# Patient Record
Sex: Female | Born: 1955 | Race: White | Hispanic: No | Smoking: Current every day smoker
Health system: Southern US, Community
[De-identification: ages and names within clinical notes are randomized; demographics above are authoritative.]

## PROBLEM LIST (undated history)

## (undated) DIAGNOSIS — D45 Polycythemia vera: Secondary | ICD-10-CM

## (undated) HISTORY — DX: Polycythemia vera: D45

---

## 2000-11-05 ENCOUNTER — Other Ambulatory Visit: Admission: RE | Admit: 2000-11-05 | Discharge: 2000-11-05 | Payer: Self-pay | Admitting: Obstetrics and Gynecology

## 2002-02-23 ENCOUNTER — Other Ambulatory Visit: Admission: RE | Admit: 2002-02-23 | Discharge: 2002-02-23 | Payer: Self-pay | Admitting: Obstetrics and Gynecology

## 2003-05-04 ENCOUNTER — Other Ambulatory Visit: Admission: RE | Admit: 2003-05-04 | Discharge: 2003-05-04 | Payer: Self-pay | Admitting: Obstetrics and Gynecology

## 2008-08-23 ENCOUNTER — Ambulatory Visit: Payer: Self-pay | Admitting: Obstetrics and Gynecology

## 2008-08-23 ENCOUNTER — Other Ambulatory Visit: Admission: RE | Admit: 2008-08-23 | Discharge: 2008-08-23 | Payer: Self-pay | Admitting: Obstetrics and Gynecology

## 2008-08-23 ENCOUNTER — Encounter: Payer: Self-pay | Admitting: Obstetrics and Gynecology

## 2009-01-10 ENCOUNTER — Emergency Department (HOSPITAL_COMMUNITY): Admission: EM | Admit: 2009-01-10 | Discharge: 2009-01-10 | Payer: Self-pay | Admitting: Family Medicine

## 2014-04-06 ENCOUNTER — Other Ambulatory Visit: Payer: Self-pay

## 2015-06-23 ENCOUNTER — Encounter: Payer: Self-pay | Admitting: Women's Health

## 2015-06-23 ENCOUNTER — Ambulatory Visit (INDEPENDENT_AMBULATORY_CARE_PROVIDER_SITE_OTHER): Payer: BLUE CROSS/BLUE SHIELD | Admitting: Women's Health

## 2015-06-23 ENCOUNTER — Other Ambulatory Visit (HOSPITAL_COMMUNITY)
Admission: RE | Admit: 2015-06-23 | Discharge: 2015-06-23 | Disposition: A | Discharge status: Home / Self Care | Payer: BLUE CROSS/BLUE SHIELD | Source: Ambulatory Visit | Attending: Women's Health | Admitting: Women's Health

## 2015-06-23 VITALS — BP 129/89 | Ht <= 58 in | Wt 115.8 lb

## 2015-06-23 DIAGNOSIS — Z1329 Encounter for screening for other suspected endocrine disorder: Secondary | ICD-10-CM

## 2015-06-23 DIAGNOSIS — F329 Major depressive disorder, single episode, unspecified: Secondary | ICD-10-CM | POA: Diagnosis not present

## 2015-06-23 DIAGNOSIS — Z13220 Encounter for screening for lipoid disorders: Secondary | ICD-10-CM

## 2015-06-23 DIAGNOSIS — F32A Depression, unspecified: Secondary | ICD-10-CM | POA: Insufficient documentation

## 2015-06-23 DIAGNOSIS — Z01419 Encounter for gynecological examination (general) (routine) without abnormal findings: Secondary | ICD-10-CM | POA: Insufficient documentation

## 2015-06-23 DIAGNOSIS — Z1151 Encounter for screening for human papillomavirus (HPV): Secondary | ICD-10-CM | POA: Diagnosis present

## 2015-06-23 DIAGNOSIS — Z13820 Encounter for screening for osteoporosis: Secondary | ICD-10-CM

## 2015-06-23 LAB — CBC WITH DIFFERENTIAL/PLATELET
BASOS ABS: 0.2 10*3/uL — AB (ref 0.0–0.1)
Basophils Relative: 1 % (ref 0–1)
EOS ABS: 0.4 10*3/uL (ref 0.0–0.7)
EOS PCT: 2 % (ref 0–5)
HEMATOCRIT: 54.8 % — AB (ref 36.0–46.0)
Hemoglobin: 18.4 g/dL — ABNORMAL HIGH (ref 12.0–15.0)
LYMPHS ABS: 3.2 10*3/uL (ref 0.7–4.0)
LYMPHS PCT: 17 % (ref 12–46)
MCH: 29 pg (ref 26.0–34.0)
MCHC: 33.6 g/dL (ref 30.0–36.0)
MCV: 86.3 fL (ref 78.0–100.0)
MONO ABS: 1.7 10*3/uL — AB (ref 0.1–1.0)
MONOS PCT: 9 % (ref 3–12)
MPV: 9 fL (ref 8.6–12.4)
Neutro Abs: 13.6 10*3/uL — ABNORMAL HIGH (ref 1.7–7.7)
Neutrophils Relative %: 71 % (ref 43–77)
PLATELETS: 1029 10*3/uL — AB (ref 150–400)
RBC: 6.35 MIL/uL — ABNORMAL HIGH (ref 3.87–5.11)
RDW: 17.4 % — AB (ref 11.5–15.5)
WBC: 19.1 10*3/uL — ABNORMAL HIGH (ref 4.0–10.5)

## 2015-06-23 LAB — COMPREHENSIVE METABOLIC PANEL
ALBUMIN: 4.6 g/dL (ref 3.6–5.1)
ALK PHOS: 98 U/L (ref 33–130)
ALT: 18 U/L (ref 6–29)
AST: 20 U/L (ref 10–35)
BILIRUBIN TOTAL: 0.5 mg/dL (ref 0.2–1.2)
BUN: 19 mg/dL (ref 7–25)
CALCIUM: 10.3 mg/dL (ref 8.6–10.4)
CO2: 18 mmol/L — AB (ref 20–31)
Chloride: 102 mmol/L (ref 98–110)
Creat: 0.76 mg/dL (ref 0.50–1.05)
GLUCOSE: 97 mg/dL (ref 65–99)
POTASSIUM: 5.7 mmol/L — AB (ref 3.5–5.3)
Sodium: 136 mmol/L (ref 135–146)
Total Protein: 7.8 g/dL (ref 6.1–8.1)

## 2015-06-23 LAB — LIPID PANEL
Cholesterol: 219 mg/dL — ABNORMAL HIGH (ref 125–200)
HDL: 51 mg/dL (ref >=46)
LDL CALC: 144 mg/dL — AB (ref <130)
TRIGLYCERIDES: 121 mg/dL (ref <150)
Total CHOL/HDL Ratio: 4.3 Ratio (ref <=5.0)
VLDL: 24 mg/dL (ref <30)

## 2015-06-23 LAB — TSH: TSH: 0.72 mIU/L

## 2015-06-23 MED ORDER — FLUOXETINE HCL 10 MG PO CAPS: ORAL_CAPSULE | ORAL | Status: DC

## 2015-06-23 NOTE — Progress Notes (Signed)
Cassandra Moses [facility] 1956-01-09 [id number]    History:    Presents for annual exam.  Postmenopausal/no HRT/no bleeding. Normal Pap and mammogram history  Not sexually active. Takes no medications. Mammogram last year, scheduled today. No colonoscopy. Reports feeling depressed, used to take Prozac daily about 5 years ago. Smokes 1/2 ppd. Did quit for 5 years, tried Chantix but caused nightmares.  Past medical history, past surgical history, family history and social history were all reviewed and documented in the EPIC chart.  Divorced. Engaged to fiance for 8 years, no plans to marry at this time. 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    ROS:  A ROS was performed and pertinent positives and negatives are included.  Exam:  Filed Vitals:   06/23/15 0834  BP: 129/89    General appearance:  Normal Thyroid:  Symmetrical, normal in size, without palpable masses or nodularity. Respiratory  Auscultation:  Clear without wheezing or rhonchi Cardiovascular  Auscultation:  Regular rate, without rubs, murmurs or gallops  Edema/varicosities:  Not grossly evident Abdominal  Soft,nontender, without masses, guarding or rebound.  Liver/spleen:  No organomegaly noted  Hernia:  None appreciated  Skin  Inspection:  Grossly normal   Breasts: Examined lying and sitting.     Right: Without masses, retractions, discharge or axillary adenopathy.     Left: Without masses, retractions, discharge or axillary adenopathy. Gentitourinary   Inguinal/mons:  Normal without inguinal adenopathy  External genitalia:  Normal  BUS/Urethra/Skene's glands:  Normal  Vagina:  Normal  Cervix:  Normal  Uterus:  normal in size, shape and contour.  Midline and mobile  Adnexa/parametria:     Rt: Without masses or tenderness.   Lt: Without masses or tenderness.  Anus and perineum: Normal  Digital rectal exam: Normal sphincter tone without palpated masses or  tenderness  Assessment/Plan:  60 y.o. DWF G0 for annual exam.    Postmenopausal/no bleeding/no HRT/not sexually active Depression/Anxiety/Prozac restarted  Plan:  Prozac 10mg  po qd, will titrate up as tolerated to 20 mg daily. Denies need for counseling at this time. SBEs, annual screening mammogram, heart healthy diet, increase exercise when possible, 30 minutes per day, increasing leisure activities. Quit smoking, cutting back daily. Lipids, CMP, CBC, TSH, Vit. D, Hep.C. Pap with HR HPV typing. Encouraged to schedule screening colonoscopy, [facility] information given. T dap discussed, declines today. DEXA will schedule.    [place], 9:12 AM 06/23/2015

## 2015-06-23 NOTE — Patient Instructions (Addendum)

## 2015-06-24 ENCOUNTER — Telehealth: Payer: Self-pay | Admitting: *Deleted

## 2015-06-24 DIAGNOSIS — D729 Disorder of white blood cells, unspecified: Secondary | ICD-10-CM

## 2015-06-24 DIAGNOSIS — D691 Qualitative platelet defects: Secondary | ICD-10-CM

## 2015-06-24 LAB — PATHOLOGIST SMEAR REVIEW

## 2015-06-24 LAB — VITAMIN D 25 HYDROXY (VIT D DEFICIENCY, FRACTURES): Vit D, 25-Hydroxy: 13 ng/mL — ABNORMAL LOW (ref 30–100)

## 2015-06-24 LAB — HEPATITIS C ANTIBODY: HCV AB: NEGATIVE

## 2015-06-24 NOTE — Telephone Encounter (Signed)
Referral placed they will contact pt to schedule. 

## 2015-06-24 NOTE — Telephone Encounter (Signed)
-----   Message from Huel Cote, NP sent at 06/24/2015  1:03 PM EST ----- Please schedule hematology appointment, white count 19,000, platelets greater than 1000. Pathology smear not completed yet. Asymptomatic other than depression. Last time in our office 2010, no labs to compare.

## 2015-06-27 LAB — CYTOLOGY - PAP

## 2015-06-28 ENCOUNTER — Encounter: Payer: Self-pay | Admitting: Women's Health

## 2015-06-28 ENCOUNTER — Other Ambulatory Visit: Payer: Self-pay | Admitting: Women's Health

## 2015-06-28 DIAGNOSIS — E559 Vitamin D deficiency, unspecified: Secondary | ICD-10-CM

## 2015-06-28 MED ORDER — VITAMIN D (ERGOCALCIFEROL) 1.25 MG (50000 UNIT) PO CAPS: 50000.0000 [IU] | ORAL_CAPSULE | ORAL | Status: DC

## 2015-06-29 ENCOUNTER — Ambulatory Visit (HOSPITAL_BASED_OUTPATIENT_CLINIC_OR_DEPARTMENT_OTHER): Payer: BLUE CROSS/BLUE SHIELD | Admitting: Hematology & Oncology

## 2015-06-29 ENCOUNTER — Other Ambulatory Visit (HOSPITAL_BASED_OUTPATIENT_CLINIC_OR_DEPARTMENT_OTHER): Payer: BLUE CROSS/BLUE SHIELD

## 2015-06-29 ENCOUNTER — Ambulatory Visit: Payer: BLUE CROSS/BLUE SHIELD

## 2015-06-29 ENCOUNTER — Encounter: Payer: Self-pay | Admitting: Hematology & Oncology

## 2015-06-29 VITALS — BP 172/78 | HR 110 | Temp 98.3°F | Resp 18 | Ht <= 58 in | Wt 115.0 lb

## 2015-06-29 DIAGNOSIS — D471 Chronic myeloproliferative disease: Secondary | ICD-10-CM

## 2015-06-29 DIAGNOSIS — N2889 Other specified disorders of kidney and ureter: Secondary | ICD-10-CM

## 2015-06-29 LAB — CBC WITH DIFFERENTIAL (CANCER CENTER ONLY)
BASO#: 0.2 10*3/uL (ref 0.0–0.2)
BASO%: 1.3 % (ref 0.0–2.0)
EOS%: 2.5 % (ref 0.0–7.0)
Eosinophils Absolute: 0.4 10*3/uL (ref 0.0–0.5)
HEMATOCRIT: 52.8 % — AB (ref 34.8–46.6)
HGB: 18.1 g/dL — ABNORMAL HIGH (ref 11.6–15.9)
LYMPH#: 3.2 10*3/uL (ref 0.9–3.3)
LYMPH%: 19 % (ref 14.0–48.0)
MCH: 29 pg (ref 26.0–34.0)
MCHC: 34.3 g/dL (ref 32.0–36.0)
MCV: 85 fL (ref 81–101)
MONO#: 1.9 10*3/uL — AB (ref 0.1–0.9)
MONO%: 11.1 % (ref 0.0–13.0)
NEUT#: 11.1 10*3/uL — ABNORMAL HIGH (ref 1.5–6.5)
NEUT%: 66.1 % (ref 39.6–80.0)
RBC: 6.25 10*6/uL — ABNORMAL HIGH (ref 3.70–5.32)
RDW: 18.8 % — AB (ref 11.1–15.7)
WBC: 16.7 10*3/uL — ABNORMAL HIGH (ref 3.9–10.0)

## 2015-06-29 LAB — COMPREHENSIVE METABOLIC PANEL (CC13)
ALBUMIN: 4.6 g/dL (ref 3.5–5.5)
ALK PHOS: 105 IU/L (ref 39–117)
ALT: 15 IU/L (ref 0–32)
AST (SGOT): 18 IU/L (ref 0–40)
Albumin/Globulin Ratio: 1.5 (ref 1.1–2.5)
BUN / CREAT RATIO: 19 (ref 9–23)
BUN: 13 mg/dL (ref 6–24)
Bilirubin Total: 0.3 mg/dL (ref 0.0–1.2)
CALCIUM: 9.7 mg/dL (ref 8.7–10.2)
CO2: 24 mmol/L (ref 18–29)
CREATININE: 0.68 mg/dL (ref 0.57–1.00)
Chloride, Ser: 102 mmol/L (ref 96–106)
GFR calc Af Amer: 112 mL/min/{1.73_m2} (ref >59)
GFR, EST NON AFRICAN AMERICAN: 97 mL/min/{1.73_m2} (ref >59)
GLUCOSE: 103 mg/dL — AB (ref 65–99)
Globulin, Total: 3 g/dL (ref 1.5–4.5)
Potassium, Ser: 3.9 mmol/L (ref 3.5–5.2)
Sodium: 136 mmol/L (ref 134–144)
TOTAL PROTEIN: 7.6 g/dL (ref 6.0–8.5)

## 2015-06-29 MED ORDER — HYDROXYUREA 500 MG PO CAPS: 500.0000 mg | ORAL_CAPSULE | Freq: Two times a day (BID) | ORAL | Status: DC

## 2015-06-29 NOTE — Progress Notes (Signed)
Referral MD  Reason for Referral: Thrombocytosis, leukocytosis and erythrocytosis   Chief Complaint  Patient presents with  . Follow-up  : My platelet count has been high.  HPI: Cassandra Moses is a 60 year old white female. She is originally from [place]. She is a Metallurgist. She is busy taking care of her parents right now.  Apparently, 3 years ago, she was seen by a dermatologist. She is found to have rosacea. She was having some blood test done. At that point, she was told that her platelet count was 900,000.  She does not have a family doctor. As such, this is not been looked into.  Recently, she was seen at the gynecologist office. She had lab work done. This showed a white cell count of 19.1. Hemoglobin 18.4. Hematocrit 54.8. Platelet count was 1,029,000.  Because of this, she was kindly referred to the [facility].  She has some electrolytes which were okay.  She was tested for hepatitis C. This was negative. Her vitamin D level was on the low side.  She has a bit of a pain in the left upper quadrant. This is been hurting for a few months. She has occasional headaches.  She's had no pain or burning in the hands or feet. She says that she has some tingling in the fingers and toes.  She's had no weight loss or weight gain. She does smoke about a pack per day of cigarettes. She has rare alcohol use.  Known in the family has had a Blood problems. A brother was diagnosed with acromegaly recently.  She's had no fevers. She's had no infections.  She's had no surgery before.  Overall, her performance status is ECOG 0.  .No past medical history on file.:  No past surgical history on file.:   Current outpatient prescriptions:  .  hydroxyurea (HYDREA) 500 MG capsule, Take 1 capsule (500 mg total) by mouth 2 (two) times daily. May take with food to minimize GI side effects., Disp: 60 capsule, Rfl: 3:  :  No Known Allergies:  No family history  on file.:  Social History   Social History  . Marital Status: Divorced    Spouse Name: N/A  . Number of Children: N/A  . Years of Education: N/A   Occupational History  . Not on file.   Social History Main Topics  . Smoking status: Current Every Day Smoker -- 1.50 packs/day    Types: Cigarettes  . Smokeless tobacco: Not on file  . Alcohol Use: 0.0 oz/week    0 Standard drinks or equivalent per week     Comment: GLASS OF WINE PER DAY  . Drug Use: No  . Sexual Activity: Not Currently     Comment: 17 YEARS , DOESNT REMEMBER   Other Topics Concern  . Not on file   Social History Narrative  :  Pertinent items are noted in HPI.  Exam: @IPVITALS @  69 white female in no obvious distress. Vital signs show a temperature of 98.3. Pulse 110. Blood pressure 172/78. Weight is 115 pounds. Head and neck exam shows no ocular or oral lesions. She has some slight facial plethora. She has no oral lesions. Shows no adenopathy in the neck. Pupils are reactive. The maybe some slight conjunctival inflammation. Lungs are clear. Cardiac exam is slightly tachycardic regular. She has no murmurs, rubs or bruits. Abdomen is soft. She has decent bowel sounds. There is some tenderness in the left upper quadrant. Her spleen tip might be  palpable with deep inspiration. There is no liver edge. Back exam shows no tenderness over the spine, ribs or hips. Extremities shows no clubbing, cyanosis or edema. She has good range of motion of her joints. Skin exam shows a ruddy complexion to her skin. Neurological exam shows no focal neurological deficits.    Recent Labs  06/29/15 1504  WBC 16.7*  HGB 18.1*  HCT 52.8*  PLT 1,147*   No results for input(s): NA, K, CL, CO2, GLUCOSE, BUN, CREATININE, CALCIUM in the last 72 hours.  Blood smear review: Normochromic and normocytic population of red blood cells. She has no new clear of blood cells. There are no teardrop cells. I see no target cells. She has no  inclusion bodies. White cells are normal in morphology and maturation. She has an increased number of white blood cells. There is no immature myeloid or lymphoid cells. Shows no hypersegmented polys. Platelets are markedly increased in number. She has large platelets. Platelets are well granulated.  Pathology: None     Assessment and Plan:  Cassandra Moses is a very nice 60 year old white female. I think she has a myeloproliferative disorder. She looks like she has polycythemia. She has the skin color for polycythemia. All 3 cell lines are increased in number.  It is apparent that she has had this for several years. Her platelet count was quite high back in 2013.  I sent off her blood for the typical genetic markers-JAK2, Calreticulin, and BCR/ABL. I have to believe that she will be positive for one of these.  I will go ahead and put her on a baby aspirin a day. I did this will be helpful.  I suspect that she probably will need to be phlebotomized. We want to keep her hematocrit below 45%.  I am also going to put her on Hydrea. I will start off on 500 mg twice a day. This should hopefully help with the leukocytosis and thrombocytosis.  I set her blood off for iron studies and erythropoietin level. I have to believe that the erythropoietin level will be low.  I spent about an hour with her. I went over her labs with her. I spent her what I thought was a problem. I don't think that we need to do a bone marrow test on her. We probably will have to do one if her genetic studies are normal.  I will see vital ultrasound of the abdomen this week. We will see she has splenomegaly.  I will have her come back in 2 weeks. By then, all of our studies will be done so we can see exactly what myeloproliferative syndrome she has.

## 2015-06-30 ENCOUNTER — Ambulatory Visit (HOSPITAL_BASED_OUTPATIENT_CLINIC_OR_DEPARTMENT_OTHER)
Admission: RE | Admit: 2015-06-30 | Discharge: 2015-06-30 | Disposition: A | Discharge status: Home / Self Care | Payer: BLUE CROSS/BLUE SHIELD | Source: Ambulatory Visit | Attending: Hematology & Oncology | Admitting: Hematology & Oncology

## 2015-06-30 DIAGNOSIS — N2889 Other specified disorders of kidney and ureter: Secondary | ICD-10-CM | POA: Diagnosis not present

## 2015-06-30 DIAGNOSIS — R1012 Left upper quadrant pain: Secondary | ICD-10-CM | POA: Insufficient documentation

## 2015-06-30 DIAGNOSIS — N200 Calculus of kidney: Secondary | ICD-10-CM | POA: Insufficient documentation

## 2015-06-30 DIAGNOSIS — D471 Chronic myeloproliferative disease: Secondary | ICD-10-CM | POA: Diagnosis not present

## 2015-06-30 LAB — CHCC SATELLITE - SMEAR

## 2015-06-30 LAB — FERRITIN: Ferritin: 20 ng/ml (ref 9–269)

## 2015-06-30 LAB — IRON AND TIBC
%SAT: 12 % — AB (ref 21–57)
IRON: 40 ug/dL — AB (ref 41–142)
TIBC: 335 ug/dL (ref 236–444)
UIBC: 295 ug/dL (ref 120–384)

## 2015-06-30 LAB — RETICULOCYTES: Reticulocyte Count: 1.4 % (ref 0.6–2.6)

## 2015-07-01 ENCOUNTER — Ambulatory Visit: Payer: BLUE CROSS/BLUE SHIELD | Admitting: Hematology & Oncology

## 2015-07-01 ENCOUNTER — Other Ambulatory Visit: Payer: BLUE CROSS/BLUE SHIELD

## 2015-07-01 ENCOUNTER — Ambulatory Visit: Payer: BLUE CROSS/BLUE SHIELD

## 2015-07-04 NOTE — Addendum Note (Signed)
Addended by: [name] on: 07/04/2015 06:22 PM   Modules accepted: Orders

## 2015-07-05 ENCOUNTER — Encounter: Payer: Self-pay | Admitting: Hematology & Oncology

## 2015-07-05 ENCOUNTER — Telehealth: Payer: Self-pay | Admitting: *Deleted

## 2015-07-05 NOTE — Telephone Encounter (Signed)
Left a  message on her answer machine. I told her that the ultrasound of the liver and spleen looked okay. However, radiologist made a comment that there is a small mass in the right kidney that looked like a "complicated cyst". Because of this, we are now forced to do an MRI to get a better look at this area. I called her about this. We will try to set the MRI up for this week. I told her to call us if she has any questions.         I told her that lab work so far is come back unremarkable. I'm still waiting for some additional test to return    Message copied from Dr. Marin Olp message.

## 2015-07-06 NOTE — Telephone Encounter (Signed)
Pt has appointment scheduled Dr. Burney Gauze in [place] on 3/8

## 2015-07-07 ENCOUNTER — Encounter: Payer: Self-pay | Admitting: Hematology & Oncology

## 2015-07-08 LAB — CALR MUTATAION(GENPATH): Calr Mutation: POSITIVE

## 2015-07-10 ENCOUNTER — Ambulatory Visit (HOSPITAL_BASED_OUTPATIENT_CLINIC_OR_DEPARTMENT_OTHER)
Admission: RE | Admit: 2015-07-10 | Discharge: 2015-07-10 | Disposition: A | Discharge status: Home / Self Care | Payer: BLUE CROSS/BLUE SHIELD | Source: Ambulatory Visit | Attending: Hematology & Oncology | Admitting: Hematology & Oncology

## 2015-07-10 DIAGNOSIS — N2889 Other specified disorders of kidney and ureter: Secondary | ICD-10-CM | POA: Insufficient documentation

## 2015-07-10 DIAGNOSIS — N281 Cyst of kidney, acquired: Secondary | ICD-10-CM | POA: Insufficient documentation

## 2015-07-10 MED ORDER — GADOBENATE DIMEGLUMINE 529 MG/ML IV SOLN: 10.0000 mL | Freq: Once | INTRAVENOUS | Status: DC | PRN

## 2015-07-12 ENCOUNTER — Ambulatory Visit: Payer: BLUE CROSS/BLUE SHIELD | Admitting: Hematology & Oncology

## 2015-07-12 ENCOUNTER — Other Ambulatory Visit: Payer: BLUE CROSS/BLUE SHIELD

## 2015-07-12 ENCOUNTER — Ambulatory Visit: Payer: BLUE CROSS/BLUE SHIELD

## 2015-07-20 ENCOUNTER — Ambulatory Visit (HOSPITAL_BASED_OUTPATIENT_CLINIC_OR_DEPARTMENT_OTHER): Payer: BLUE CROSS/BLUE SHIELD | Admitting: Hematology & Oncology

## 2015-07-20 ENCOUNTER — Ambulatory Visit (HOSPITAL_BASED_OUTPATIENT_CLINIC_OR_DEPARTMENT_OTHER): Payer: BLUE CROSS/BLUE SHIELD

## 2015-07-20 ENCOUNTER — Other Ambulatory Visit (HOSPITAL_BASED_OUTPATIENT_CLINIC_OR_DEPARTMENT_OTHER): Payer: BLUE CROSS/BLUE SHIELD

## 2015-07-20 ENCOUNTER — Encounter: Payer: Self-pay | Admitting: Hematology & Oncology

## 2015-07-20 VITALS — BP 157/91 | HR 102

## 2015-07-20 VITALS — BP 154/93 | HR 113 | Temp 98.4°F | Resp 16 | Ht <= 58 in | Wt 113.0 lb

## 2015-07-20 DIAGNOSIS — D471 Chronic myeloproliferative disease: Secondary | ICD-10-CM

## 2015-07-20 DIAGNOSIS — D45 Polycythemia vera: Secondary | ICD-10-CM

## 2015-07-20 HISTORY — DX: Polycythemia vera: D45

## 2015-07-20 LAB — COMPREHENSIVE METABOLIC PANEL
ALT: 15 U/L (ref 0–55)
AST: 23 U/L (ref 5–34)
Albumin: 4 g/dL (ref 3.5–5.0)
Alkaline Phosphatase: 90 U/L (ref 40–150)
Anion Gap: 10 mEq/L (ref 3–11)
BILIRUBIN TOTAL: 0.46 mg/dL (ref 0.20–1.20)
BUN: 9.6 mg/dL (ref 7.0–26.0)
CO2: 25 meq/L (ref 22–29)
Calcium: 9.4 mg/dL (ref 8.4–10.4)
Chloride: 104 mEq/L (ref 98–109)
Creatinine: 0.8 mg/dL (ref 0.6–1.1)
EGFR: 77 mL/min/{1.73_m2} — AB (ref >90)
GLUCOSE: 111 mg/dL (ref 70–140)
POTASSIUM: 4.1 meq/L (ref 3.5–5.1)
SODIUM: 139 meq/L (ref 136–145)
TOTAL PROTEIN: 7.6 g/dL (ref 6.4–8.3)

## 2015-07-20 LAB — CBC WITH DIFFERENTIAL (CANCER CENTER ONLY)
BASO#: 0.1 10*3/uL (ref 0.0–0.2)
BASO%: 1.3 % (ref 0.0–2.0)
EOS%: 1 % (ref 0.0–7.0)
Eosinophils Absolute: 0.1 10*3/uL (ref 0.0–0.5)
HCT: 53 % — ABNORMAL HIGH (ref 34.8–46.6)
HEMOGLOBIN: 18.3 g/dL — AB (ref 11.6–15.9)
LYMPH#: 1.8 10*3/uL (ref 0.9–3.3)
LYMPH%: 17.1 % (ref 14.0–48.0)
MCH: 30.6 pg (ref 26.0–34.0)
MCHC: 34.5 g/dL (ref 32.0–36.0)
MCV: 89 fL (ref 81–101)
MONO#: 1.1 10*3/uL — ABNORMAL HIGH (ref 0.1–0.9)
MONO%: 10.3 % (ref 0.0–13.0)
NEUT%: 70.3 % (ref 39.6–80.0)
NEUTROS ABS: 7.5 10*3/uL — AB (ref 1.5–6.5)
Platelets: 424 10*3/uL — ABNORMAL HIGH (ref 145–400)
RBC: 5.99 10*6/uL — ABNORMAL HIGH (ref 3.70–5.32)
RDW: 21.2 % — ABNORMAL HIGH (ref 11.1–15.7)
WBC: 10.7 10*3/uL — AB (ref 3.9–10.0)

## 2015-07-20 LAB — CHCC SATELLITE - SMEAR

## 2015-07-20 NOTE — Progress Notes (Signed)
Cassandra Moses presents today for phlebotomy per MD orders. Phlebotomy procedure started at 0919 and ended at 0930 due to needle clotting. Cannulated with 20g needle without difficulty.. Approximately 500 mls removed. Patient observed for 30 minutes after procedure without any incident. Patient tolerated procedure well. IV needle removed intact.

## 2015-07-20 NOTE — Progress Notes (Signed)
Hematology and Oncology Follow Up Visit  Cassandra Moses [id number] Jul 31, 1955 60 y.o. 07/20/2015   Principle Diagnosis:   Polycythemia vera-JAK2 positive  Current Therapy:    Hydrea 1000 mg by mouth daily  Aspirin 81 mg by mouth daily  Phlebotomy to maintain hematocrit below 45%     Interim History:  Cassandra Moses is back for follow-up. This is Cassandra Moses second office visit. We first saw Cassandra Moses back on February 15. At that point time, Cassandra Moses platelets was over 1 million.  I felt that Cassandra Moses clinical exam was very consistent with polycythemia. We sent off the genetic mutations and she was positive for JAK2 mutation.  I did do a ultrasound of Cassandra Moses abdomen. This showed no splenomegaly. However, there was a small mass in the right kidney. We then got a MRI of the abdomen. This did not show any malignant nature in this small renal mass.  Cassandra Moses iron studies showed a ferritin of 20 with iron saturation of 12%.  Everything clearly points to Cassandra Moses having polycythemia. She is unwell with the Hydrea. She still is tired. I think this probably has to do with the increased hematocrit.  She is still working. She's having no problems with pain in the hands or feet. She has occasional headache. There's no visual changes.  Overall, Cassandra Moses performance status is ECOG 1.  Medications:  Current outpatient prescriptions:  .  FLUoxetine (PROZAC) 10 MG capsule, Take 10 mg by mouth 2 (two) times daily., Disp: , Rfl:  .  hydroxyurea (HYDREA) 500 MG capsule, Take 1 capsule (500 mg total) by mouth 2 (two) times daily. May take with food to minimize GI side effects., Disp: 60 capsule, Rfl: 3  Allergies: No Known Allergies  Past Medical History, Surgical history, Social history, and Family History were reviewed and updated.  Review of Systems: As above  Physical Exam:  height is 4' 10"  (1.473 m) and weight is 113 lb (51.256 kg). Cassandra Moses oral temperature is 98.4 F (36.9 C). Cassandra Moses blood pressure is 154/93 and Cassandra Moses pulse is  113. Cassandra Moses respiration is 16.   Wt Readings from Last 3 Encounters:  07/20/15 113 lb (51.256 kg)  06/29/15 115 lb (52.164 kg)  06/23/15 115 lb 12.8 oz (52.527 kg)     Head and neck exam shows no ocular or oral lesions. She has some slight facial plethora. She has no oral lesions. Shows no adenopathy in the neck. Pupils are reactive. The maybe some slight conjunctival inflammation. Lungs are clear. Cardiac exam is slightly tachycardic regular. She has no murmurs, rubs or bruits. Abdomen is soft. She has decent bowel sounds. There is some tenderness in the left upper quadrant. Cassandra Moses spleen tip might be palpable with deep inspiration. There is no liver edge. Back exam shows no tenderness over the spine, ribs or hips. Extremities shows no clubbing, cyanosis or edema. She has good range of motion of Cassandra Moses joints. Skin exam shows a ruddy complexion to Cassandra Moses skin. Neurological exam shows no focal neurological deficits.   Lab Results  Component Value Date   WBC 10.7* 07/20/2015   HGB 18.3* 07/20/2015   HCT 53.0* 07/20/2015   MCV 89 07/20/2015   PLT 424* 07/20/2015     Chemistry      Component Value Date/Time   NA 139 07/20/2015 0820   NA 136 06/29/2015 1505   NA 136 06/23/2015 0914   K 4.1 07/20/2015 0820   K 3.9 06/29/2015 1505   K 5.7* 06/23/2015 0914   CL 102  06/29/2015 1505   CL 102 06/23/2015 0914   CO2 25 07/20/2015 0820   CO2 24 06/29/2015 1505   CO2 18* 06/23/2015 0914   BUN 9.6 07/20/2015 0820   BUN 13 06/29/2015 1505   BUN 19 06/23/2015 0914   CREATININE 0.8 07/20/2015 0820   CREATININE 0.68 06/29/2015 1505   CREATININE 0.76 06/23/2015 0914      Component Value Date/Time   CALCIUM 9.4 07/20/2015 0820   CALCIUM 9.7 06/29/2015 1505   CALCIUM 10.3 06/23/2015 0914   ALKPHOS 90 07/20/2015 0820   ALKPHOS 105 06/29/2015 1505   ALKPHOS 98 06/23/2015 0914   AST 23 07/20/2015 0820   AST 18 06/29/2015 1505   AST 20 06/23/2015 0914   ALT 15 07/20/2015 0820   ALT 15 06/29/2015 1505    ALT 18 06/23/2015 0914   BILITOT 0.46 07/20/2015 0820   BILITOT 0.3 06/29/2015 1505   BILITOT 0.5 06/23/2015 0914         Impression and Plan: Ms. Plaia is A 60 year old white female with polycythemia vera. She clearly has polycythemia vera. She is JAK2 positive. Despite the Hydrea, she still has erythrocytosis. I suspect that Cassandra Moses red cell mass is elevated.  I think we'll have to physically get blood out of Cassandra Moses. I think we'll have to force Cassandra Moses hematocrit down below 45% with phlebotomies.  For now, we will phlebotomize Cassandra Moses weekly. I really want to get Cassandra Moses hematocrit down. I think was to get Cassandra Moses hematocrit down below 45%, she will feel better. She will have more energy.  As far as the Hydrea is concerned, I will just keep Cassandra Moses on the 1000 mg daily dose for right now.  She does not need a bone marrow biopsy. I don't think a bone marrow biopsy will give [place] any more information and will we have already.  I would like to see Cassandra Moses back in about 3 weeks. We will phlebotomize Cassandra Moses weekly.  I don't see that we have to use JAKAFI at this point. I'm not sure that this would really add more to the Hydrea and baby aspirin. She does not have splenomegaly. She has no systemic symptoms. If we do find that Cassandra Moses hematocrit is having a tough time coming down, then we might have to consider JAKAFI.  I spent about 35 minutes with Cassandra Moses today. I answered all of Cassandra Moses questions.  [name], MD 3/8/20171:27 PM

## 2015-07-20 NOTE — Patient Instructions (Signed)
Therapeutic Phlebotomy, Care After  Refer to this sheet in the next few weeks. These instructions provide you with information about caring for yourself after your procedure. Your health care provider may also give you more specific instructions. Your treatment has been planned according to current medical practices, but problems sometimes occur. Call your health care provider if you have any problems or questions after your procedure.  WHAT TO EXPECT AFTER THE PROCEDURE  After your procedure, it is common to have:   Light-headedness or dizziness. You may feel faint.   Nausea.   Tiredness.  HOME CARE INSTRUCTIONS  Activities   Return to your normal activities as directed by your health care provider. Most people can go back to their normal activities right away.   Avoid strenuous physical activity and heavy lifting or pulling for about 5 hours after the procedure. Do not lift anything that is heavier than 10 lb (4.5 kg).   Athletes should avoid strenuous exercise for at least 12 hours.   Change positions slowly for the remainder of the day. This will help to prevent light-headedness or fainting.   If you feel light-headed, lie down until the feeling goes away.  Eating and Drinking   Be sure to eat well-balanced meals for the next 24 hours.   Drink enough fluid to keep your urine clear or pale yellow.   Avoid drinking alcohol on the day that you had the procedure.  Care of the Needle Insertion Site   Keep your bandage dry. You can remove the bandage after about 5 hours or as directed by your health care provider.   If you have bleeding from the needle insertion site, elevate your arm and press firmly on the site until the bleeding stops.   If you have bruising at the site, apply ice to the area:   Put ice in a plastic bag.   Place a towel between your skin and the bag.   Leave the ice on for 20 minutes, 2-3 times a day for the first 24 hours.   If the swelling does not go away after 24 hours, apply  a warm, moist washcloth to the area for 20 minutes, 2-3 times a day.  General Instructions   Avoid smoking for at least 30 minutes after the procedure.   Keep all follow-up visits as directed by your health care provider. It is important to continue with further therapeutic phlebotomy treatments as directed.  SEEK MEDICAL CARE IF:   You have redness, swelling, or pain at the needle insertion site.   You have fluid, blood, or pus coming from the needle insertion site.   You feel light-headed, dizzy, or nauseated, and the feeling does not go away.   You notice new bruising at the needle insertion site.   You feel weaker than normal.   You have a fever or chills.  SEEK IMMEDIATE MEDICAL CARE IF:   You have severe nausea or vomiting.   You have chest pain.   You have trouble breathing.    This information is not intended to replace advice given to you by your health care provider. Make sure you discuss any questions you have with your health care provider.    Document Released: 10/02/2010 Document Revised: 09/14/2014 Document Reviewed: 04/26/2014  Elsevier Interactive Patient Education 2016 [employer]

## 2015-07-22 LAB — VON WILLEBRAND PANEL
Factor VIII Activity: 86 % (ref 57–163)
PDF IMAGE: 0
vWF Activity: 32 % — ABNORMAL LOW (ref 50–200)
von Willebrand Factor (vWF) Ag: 55 % (ref 50–200)

## 2015-07-26 ENCOUNTER — Ambulatory Visit (HOSPITAL_BASED_OUTPATIENT_CLINIC_OR_DEPARTMENT_OTHER): Payer: BLUE CROSS/BLUE SHIELD

## 2015-07-26 ENCOUNTER — Other Ambulatory Visit (HOSPITAL_BASED_OUTPATIENT_CLINIC_OR_DEPARTMENT_OTHER): Payer: BLUE CROSS/BLUE SHIELD

## 2015-07-26 VITALS — BP 142/91 | HR 116 | Temp 97.7°F | Resp 16

## 2015-07-26 DIAGNOSIS — D45 Polycythemia vera: Secondary | ICD-10-CM

## 2015-07-26 LAB — CBC WITH DIFFERENTIAL (CANCER CENTER ONLY)
BASO#: 0.2 10*3/uL (ref 0.0–0.2)
BASO%: 1.2 % (ref 0.0–2.0)
EOS%: 0.5 % (ref 0.0–7.0)
Eosinophils Absolute: 0.1 10*3/uL (ref 0.0–0.5)
HEMATOCRIT: 51.9 % — AB (ref 34.8–46.6)
HEMOGLOBIN: 18.2 g/dL — AB (ref 11.6–15.9)
LYMPH#: 2.6 10*3/uL (ref 0.9–3.3)
LYMPH%: 19.8 % (ref 14.0–48.0)
MCH: 31.3 pg (ref 26.0–34.0)
MCHC: 35.1 g/dL (ref 32.0–36.0)
MCV: 89 fL (ref 81–101)
MONO#: 1.1 10*3/uL — ABNORMAL HIGH (ref 0.1–0.9)
MONO%: 8.1 % (ref 0.0–13.0)
NEUT%: 70.4 % (ref 39.6–80.0)
NEUTROS ABS: 9.2 10*3/uL — AB (ref 1.5–6.5)
Platelets: 609 10*3/uL — ABNORMAL HIGH (ref 145–400)
RBC: 5.82 10*6/uL — AB (ref 3.70–5.32)
RDW: 21.8 % — ABNORMAL HIGH (ref 11.1–15.7)
WBC: 13 10*3/uL — AB (ref 3.9–10.0)

## 2015-07-26 NOTE — Progress Notes (Signed)
Per Dr Marin Olp, pt to increase Hydrea to 1000mg  twice daily. Written instructions given to pt who verbalizes understanding. dph

## 2015-07-26 NOTE — Patient Instructions (Signed)

## 2015-07-27 NOTE — Progress Notes (Signed)
Cassandra Moses presents today for phlebotomy per MD orders. Phlebotomy procedure started at 1235 and ended at 1245. 500 grams removed using 18g IV catheter.  Patient observed for 30 minutes after procedure without any incident. Patient tolerated procedure well. IV needle removed intact.  Late note from 07/26/15.

## 2015-08-04 ENCOUNTER — Other Ambulatory Visit (HOSPITAL_BASED_OUTPATIENT_CLINIC_OR_DEPARTMENT_OTHER): Payer: BLUE CROSS/BLUE SHIELD

## 2015-08-04 ENCOUNTER — Ambulatory Visit: Payer: BLUE CROSS/BLUE SHIELD

## 2015-08-04 DIAGNOSIS — D45 Polycythemia vera: Secondary | ICD-10-CM | POA: Diagnosis not present

## 2015-08-04 LAB — CBC WITH DIFFERENTIAL (CANCER CENTER ONLY)
BASO#: 0.1 10*3/uL (ref 0.0–0.2)
BASO%: 1.2 % (ref 0.0–2.0)
EOS ABS: 0.1 10*3/uL (ref 0.0–0.5)
EOS%: 1 % (ref 0.0–7.0)
HCT: 44.1 % (ref 34.8–46.6)
HGB: 15.4 g/dL (ref 11.6–15.9)
LYMPH#: 1.5 10*3/uL (ref 0.9–3.3)
LYMPH%: 19.2 % (ref 14.0–48.0)
MCH: 32.5 pg (ref 26.0–34.0)
MCHC: 34.9 g/dL (ref 32.0–36.0)
MCV: 93 fL (ref 81–101)
MONO#: 0.6 10*3/uL (ref 0.1–0.9)
MONO%: 8.1 % (ref 0.0–13.0)
NEUT#: 5.5 10*3/uL (ref 1.5–6.5)
NEUT%: 70.5 % (ref 39.6–80.0)
PLATELETS: 580 10*3/uL — AB (ref 145–400)
RBC: 4.74 10*6/uL (ref 3.70–5.32)
RDW: 22.3 % — ABNORMAL HIGH (ref 11.1–15.7)
WBC: 7.8 10*3/uL (ref 3.9–10.0)

## 2015-08-04 NOTE — Progress Notes (Signed)
Per Dr Marin Olp; no phlebotomy needed today. Gave patient a current schedule and patient to return next Thursday at 0800 for labs md and possible phlebotomy. Patient verbalized understanding.

## 2015-08-08 ENCOUNTER — Telehealth: Payer: Self-pay | Admitting: *Deleted

## 2015-08-08 DIAGNOSIS — Z13820 Encounter for screening for osteoporosis: Secondary | ICD-10-CM

## 2015-08-08 NOTE — Telephone Encounter (Signed)
Cassandra Moses called back and said place order in epic and they will call pt to schedule. I called and left that information on pt voicemail, order placed.

## 2015-08-08 NOTE — Telephone Encounter (Signed)
Pt called requesting order sent to [facility],has appointment with Dr.Ennver on 08/11/15 per pt she was told to dexa could be done there as well. I called his office at [phone] and left a message with the nurse for further instructions on how to proceed.

## 2015-08-09 ENCOUNTER — Ambulatory Visit (HOSPITAL_BASED_OUTPATIENT_CLINIC_OR_DEPARTMENT_OTHER)
Admission: RE | Admit: 2015-08-09 | Discharge: 2015-08-09 | Disposition: A | Discharge status: Home / Self Care | Payer: BLUE CROSS/BLUE SHIELD | Source: Ambulatory Visit | Attending: Women's Health | Admitting: Women's Health

## 2015-08-09 ENCOUNTER — Encounter (HOSPITAL_BASED_OUTPATIENT_CLINIC_OR_DEPARTMENT_OTHER): Payer: Self-pay

## 2015-08-09 DIAGNOSIS — M8580 Other specified disorders of bone density and structure, unspecified site: Secondary | ICD-10-CM | POA: Diagnosis not present

## 2015-08-09 DIAGNOSIS — Z780 Asymptomatic menopausal state: Secondary | ICD-10-CM | POA: Insufficient documentation

## 2015-08-09 DIAGNOSIS — Z13820 Encounter for screening for osteoporosis: Secondary | ICD-10-CM | POA: Insufficient documentation

## 2015-08-09 DIAGNOSIS — Z720 Tobacco use: Secondary | ICD-10-CM | POA: Insufficient documentation

## 2015-08-11 ENCOUNTER — Ambulatory Visit: Payer: BLUE CROSS/BLUE SHIELD

## 2015-08-11 ENCOUNTER — Ambulatory Visit (HOSPITAL_BASED_OUTPATIENT_CLINIC_OR_DEPARTMENT_OTHER): Payer: BLUE CROSS/BLUE SHIELD | Admitting: Hematology & Oncology

## 2015-08-11 ENCOUNTER — Other Ambulatory Visit (HOSPITAL_BASED_OUTPATIENT_CLINIC_OR_DEPARTMENT_OTHER): Payer: BLUE CROSS/BLUE SHIELD

## 2015-08-11 ENCOUNTER — Encounter: Payer: Self-pay | Admitting: Hematology & Oncology

## 2015-08-11 VITALS — BP 159/84 | HR 105 | Temp 97.3°F | Resp 18 | Ht <= 58 in | Wt 114.0 lb

## 2015-08-11 DIAGNOSIS — D45 Polycythemia vera: Secondary | ICD-10-CM

## 2015-08-11 LAB — CBC WITH DIFFERENTIAL (CANCER CENTER ONLY)
BASO#: 0.1 10*3/uL (ref 0.0–0.2)
BASO%: 1.1 % (ref 0.0–2.0)
EOS%: 0.7 % (ref 0.0–7.0)
Eosinophils Absolute: 0 10*3/uL (ref 0.0–0.5)
HCT: 42.7 % (ref 34.8–46.6)
HEMOGLOBIN: 15.1 g/dL (ref 11.6–15.9)
LYMPH#: 1.4 10*3/uL (ref 0.9–3.3)
LYMPH%: 24.8 % (ref 14.0–48.0)
MCH: 33.6 pg (ref 26.0–34.0)
MCHC: 35.4 g/dL (ref 32.0–36.0)
MCV: 95 fL (ref 81–101)
MONO#: 0.6 10*3/uL (ref 0.1–0.9)
MONO%: 11.4 % (ref 0.0–13.0)
NEUT%: 62 % (ref 39.6–80.0)
NEUTROS ABS: 3.4 10*3/uL (ref 1.5–6.5)
Platelets: 154 10*3/uL (ref 145–400)
RBC: 4.49 10*6/uL (ref 3.70–5.32)
RDW: 23.1 % — ABNORMAL HIGH (ref 11.1–15.7)
WBC: 5.5 10*3/uL (ref 3.9–10.0)

## 2015-08-11 LAB — COMPREHENSIVE METABOLIC PANEL
ALT: 15 U/L (ref 0–55)
AST: 19 U/L (ref 5–34)
Albumin: 4 g/dL (ref 3.5–5.0)
Alkaline Phosphatase: 82 U/L (ref 40–150)
Anion Gap: 9 mEq/L (ref 3–11)
BILIRUBIN TOTAL: 0.51 mg/dL (ref 0.20–1.20)
BUN: 13.3 mg/dL (ref 7.0–26.0)
CHLORIDE: 98 meq/L (ref 98–109)
CO2: 23 meq/L (ref 22–29)
Calcium: 9.2 mg/dL (ref 8.4–10.4)
Creatinine: 0.8 mg/dL (ref 0.6–1.1)
EGFR: 87 mL/min/{1.73_m2} — AB (ref >90)
GLUCOSE: 110 mg/dL (ref 70–140)
POTASSIUM: 3.9 meq/L (ref 3.5–5.1)
SODIUM: 131 meq/L — AB (ref 136–145)
TOTAL PROTEIN: 7.4 g/dL (ref 6.4–8.3)

## 2015-08-11 LAB — LACTATE DEHYDROGENASE: LDH: 203 U/L (ref 125–245)

## 2015-08-11 NOTE — Progress Notes (Signed)
Hematology and Oncology Follow Up Visit  Cassandra Moses [id number] 25-Jun-1955 60 y.o. 08/11/2015   Principle Diagnosis:   Polycythemia vera-JAK2 positive  Current Therapy:    Hydrea 1000 mg by mouth daily  Aspirin 81 mg by mouth daily  Phlebotomy to maintain hematocrit below 45%     Interim History:  Cassandra Moses is back for follow-up. She is doing much better. She feels better. She has gotten phlebotomized.  She is on Hydrea. She says she takes it twice a day.  She is still working. She does not feel as tired.  She's had no headache.  His been no cough or shortness of breath. She's had no sweats. She's had no change in bowel or bladder habits. She's had no leg swelling.  She's had no pruritus. Her skin does not feel is dry. She does stay very well hydrated.   Overall, her performance status is ECOG 1.  Medications:  Current outpatient prescriptions:  .  FLUoxetine (PROZAC) 10 MG capsule, Take 10 mg by mouth 2 (two) times daily., Disp: , Rfl:  .  hydroxyurea (HYDREA) 500 MG capsule, Take 1 capsule (500 mg total) by mouth 2 (two) times daily. May take with food to minimize GI side effects., Disp: 60 capsule, Rfl: 3  Allergies: No Known Allergies  Past Medical History, Surgical history, Social history, and Family History were reviewed and updated.  Review of Systems: As above  Physical Exam:  height is 4\' 10"  (1.473 m) and weight is 114 lb (51.71 kg). Her oral temperature is 97.3 F (36.3 C). Her blood pressure is 159/84 and her pulse is 105. Her respiration is 18.   Wt Readings from Last 3 Encounters:  08/11/15 114 lb (51.71 kg)  07/20/15 113 lb (51.256 kg)  06/29/15 115 lb (52.164 kg)     Head and neck exam shows no ocular or oral lesions. She has some slight facial plethora. She has no oral lesions. Shows no adenopathy in the neck. Pupils are reactive. The maybe some slight conjunctival inflammation. Lungs are clear. Cardiac exam is slightly tachycardic  regular. She has no murmurs, rubs or bruits. Abdomen is soft. She has decent bowel sounds. There is some tenderness in the left upper quadrant. Her spleen tip might be palpable with deep inspiration. There is no liver edge. Back exam shows no tenderness over the spine, ribs or hips. Extremities shows no clubbing, cyanosis or edema. She has good range of motion of her joints. Skin exam shows a ruddy complexion to her skin. Neurological exam shows no focal neurological deficits.   Lab Results  Component Value Date   WBC 5.5 08/11/2015   HGB 15.1 08/11/2015   HCT 42.7 08/11/2015   MCV 95 08/11/2015   PLT 154 08/11/2015     Chemistry      Component Value Date/Time   NA 139 07/20/2015 0820   NA 136 06/29/2015 1505   NA 136 06/23/2015 0914   K 4.1 07/20/2015 0820   K 3.9 06/29/2015 1505   K 5.7* 06/23/2015 0914   CL 102 06/29/2015 1505   CL 102 06/23/2015 0914   CO2 25 07/20/2015 0820   CO2 24 06/29/2015 1505   CO2 18* 06/23/2015 0914   BUN 9.6 07/20/2015 0820   BUN 13 06/29/2015 1505   BUN 19 06/23/2015 0914   CREATININE 0.8 07/20/2015 0820   CREATININE 0.68 06/29/2015 1505   CREATININE 0.76 06/23/2015 0914      Component Value Date/Time   CALCIUM 9.4  07/20/2015 0820   CALCIUM 9.7 06/29/2015 1505   CALCIUM 10.3 06/23/2015 0914   ALKPHOS 90 07/20/2015 0820   ALKPHOS 105 06/29/2015 1505   ALKPHOS 98 06/23/2015 0914   AST 23 07/20/2015 0820   AST 18 06/29/2015 1505   AST 20 06/23/2015 0914   ALT 15 07/20/2015 0820   ALT 15 06/29/2015 1505   ALT 18 06/23/2015 0914   BILITOT 0.46 07/20/2015 0820   BILITOT 0.3 06/29/2015 1505   BILITOT 0.5 06/23/2015 0914         Impression and Plan: Cassandra Moses is A 60 year old white female with polycythemia vera. She clearly has polycythemia vera. She is JAK2 positive.   She responded very well to the Hydrea and phlebotomy. She does not need to be phlebotomized which is wonderful.  We will keep her Hydrea dose at 1000 mg a  day.  We'll plan to get her back in about 4 weeks now. I feel confident that her blood counts will stabilize somewhat.    [name], MD 3/30/20178:44 AM

## 2015-08-11 NOTE — Progress Notes (Signed)
Patient does not need phlebotomy today per Dr Marin Olp

## 2015-09-15 ENCOUNTER — Ambulatory Visit (HOSPITAL_BASED_OUTPATIENT_CLINIC_OR_DEPARTMENT_OTHER): Payer: Self-pay | Admitting: Hematology & Oncology

## 2015-09-15 ENCOUNTER — Encounter: Payer: Self-pay | Admitting: Hematology & Oncology

## 2015-09-15 ENCOUNTER — Other Ambulatory Visit (HOSPITAL_BASED_OUTPATIENT_CLINIC_OR_DEPARTMENT_OTHER): Payer: Self-pay

## 2015-09-15 ENCOUNTER — Ambulatory Visit: Payer: Self-pay

## 2015-09-15 VITALS — BP 150/90 | HR 107 | Temp 98.2°F | Resp 18 | Ht <= 58 in | Wt 111.0 lb

## 2015-09-15 DIAGNOSIS — D45 Polycythemia vera: Secondary | ICD-10-CM

## 2015-09-15 LAB — FERRITIN: Ferritin: 81 ng/ml (ref 9–269)

## 2015-09-15 LAB — CBC WITH DIFFERENTIAL (CANCER CENTER ONLY)
BASO#: 0.1 10*3/uL (ref 0.0–0.2)
BASO%: 0.9 % (ref 0.0–2.0)
EOS ABS: 0.1 10*3/uL (ref 0.0–0.5)
EOS%: 0.6 % (ref 0.0–7.0)
HCT: 37.3 % (ref 34.8–46.6)
HGB: 13.5 g/dL (ref 11.6–15.9)
LYMPH#: 2 10*3/uL (ref 0.9–3.3)
LYMPH%: 19.7 % (ref 14.0–48.0)
MCH: 37 pg — AB (ref 26.0–34.0)
MCHC: 36.2 g/dL — ABNORMAL HIGH (ref 32.0–36.0)
MCV: 102 fL — ABNORMAL HIGH (ref 81–101)
MONO#: 1.2 10*3/uL — ABNORMAL HIGH (ref 0.1–0.9)
MONO%: 12 % (ref 0.0–13.0)
NEUT#: 6.7 10*3/uL — ABNORMAL HIGH (ref 1.5–6.5)
NEUT%: 66.8 % (ref 39.6–80.0)
Platelets: 306 10*3/uL (ref 145–400)
RBC: 3.65 10*6/uL — ABNORMAL LOW (ref 3.70–5.32)
WBC: 10 10*3/uL (ref 3.9–10.0)

## 2015-09-15 LAB — COMPREHENSIVE METABOLIC PANEL
ALBUMIN: 3.8 g/dL (ref 3.5–5.0)
ALK PHOS: 101 U/L (ref 40–150)
ALT: 18 U/L (ref 0–55)
AST: 19 U/L (ref 5–34)
Anion Gap: 9 mEq/L (ref 3–11)
BUN: 10.4 mg/dL (ref 7.0–26.0)
CALCIUM: 9.3 mg/dL (ref 8.4–10.4)
CHLORIDE: 100 meq/L (ref 98–109)
CO2: 24 mEq/L (ref 22–29)
Creatinine: 0.8 mg/dL (ref 0.6–1.1)
EGFR: 85 mL/min/{1.73_m2} — AB (ref >90)
Glucose: 114 mg/dl (ref 70–140)
POTASSIUM: 3.9 meq/L (ref 3.5–5.1)
SODIUM: 133 meq/L — AB (ref 136–145)
Total Bilirubin: 0.48 mg/dL (ref 0.20–1.20)
Total Protein: 7.2 g/dL (ref 6.4–8.3)

## 2015-09-15 LAB — IRON AND TIBC
%SAT: 37 % (ref 21–57)
Iron: 104 ug/dL (ref 41–142)
TIBC: 280 ug/dL (ref 236–444)
UIBC: 176 ug/dL (ref 120–384)

## 2015-09-15 LAB — LACTATE DEHYDROGENASE: LDH: 230 U/L (ref 125–245)

## 2015-09-15 LAB — CHCC SATELLITE - SMEAR

## 2015-09-15 NOTE — Progress Notes (Signed)
No phlebotomy today per dr. ennever 

## 2015-09-15 NOTE — Progress Notes (Signed)
Hematology and Oncology Follow Up Visit  Cassandra Moses [id number] [birth year] 60 y.o. 09/15/2015   Principle Diagnosis:   Polycythemia vera-JAK2 positive  Current Therapy:    Hydrea 1000 mg by mouth daily  Aspirin 81 mg by mouth daily  Phlebotomy to maintain hematocrit below 45%     Interim History:  Cassandra Moses is back for follow-up. She is doing much better. She feels better. Her skin color has "lightened up". She definitely does not look as "full".   She has gotten phlebotomized. This really has been a plus for her. I think she pies not been phlebotomize now for about a month or so.  She is on Hydrea. She says she takes it twice a day. She does state that she takes an extra Hydrea on occasion. I would also problem with her doing this.  She is still working. She does not feel as tired.  Unfortunately, her mom, who has dementia, fell and broke her right femur. She has surgery for this. She is in a rehabilitation center now.  There has been no cough or shortness of breath. She's had no sweats. She's had no change in bowel or bladder habits. She's had no leg swelling.  She's had no pruritus. Her skin does not feel is dry. She does stay very well hydrated.   Overall, her performance status is ECOG 1.  Medications:  Current outpatient prescriptions:  .  FLUoxetine (PROZAC) 10 MG capsule, Take 10 mg by mouth 2 (two) times daily., Disp: , Rfl:  .  hydroxyurea (HYDREA) 500 MG capsule, Take 1 capsule (500 mg total) by mouth 2 (two) times daily. May take with food to minimize GI side effects., Disp: 60 capsule, Rfl: 3  Allergies: No Known Allergies  Past Medical History, Surgical history, Social history, and Family History were reviewed and updated.  Review of Systems: As above  Physical Exam:  height is 4\' 10"  (1.473 m) and weight is 111 lb (50.349 kg). Her oral temperature is 98.2 F (36.8 C). Her blood pressure is 150/90 and her pulse is 107. Her respiration is 18.     Wt Readings from Last 3 Encounters:  09/15/15 111 lb (50.349 kg)  08/11/15 114 lb (51.71 kg)  07/20/15 113 lb (51.256 kg)     Head and neck exam shows no ocular or oral lesions. She has some slight facial plethora. She has no oral lesions. Shows no adenopathy in the neck. Pupils are reactive. The maybe some slight conjunctival inflammation. Lungs are clear. Cardiac exam is slightly tachycardic regular. She has no murmurs, rubs or bruits. Abdomen is soft. She has decent bowel sounds. There is some tenderness in the left upper quadrant. Her spleen tip might be palpable with deep inspiration. There is no liver edge. Back exam shows no tenderness over the spine, ribs or hips. Extremities shows no clubbing, cyanosis or edema. She has good range of motion of her joints. Skin exam shows a ruddy complexion to her skin. Neurological exam shows no focal neurological deficits.   Lab Results  Component Value Date   WBC 10.0 09/15/2015   HGB 13.5 09/15/2015   HCT 37.3 09/15/2015   MCV 102* 09/15/2015   PLT 306 09/15/2015     Chemistry      Component Value Date/Time   NA 131* 08/11/2015 0813   NA 136 06/29/2015 1505   NA 136 06/23/2015 0914   K 3.9 08/11/2015 0813   K 3.9 06/29/2015 1505   K 5.7* 06/23/2015 [id number]  CL 102 06/29/2015 1505   CL 102 06/23/2015 0914   CO2 23 08/11/2015 0813   CO2 24 06/29/2015 1505   CO2 18* 06/23/2015 0914   BUN 13.3 08/11/2015 0813   BUN 13 06/29/2015 1505   BUN 19 06/23/2015 0914   CREATININE 0.8 08/11/2015 0813   CREATININE 0.68 06/29/2015 1505   CREATININE 0.76 06/23/2015 0914      Component Value Date/Time   CALCIUM 9.2 08/11/2015 0813   CALCIUM 9.7 06/29/2015 1505   CALCIUM 10.3 06/23/2015 0914   ALKPHOS 82 08/11/2015 0813   ALKPHOS 105 06/29/2015 1505   ALKPHOS 98 06/23/2015 0914   AST 19 08/11/2015 0813   AST 18 06/29/2015 1505   AST 20 06/23/2015 0914   ALT 15 08/11/2015 0813   ALT 15 06/29/2015 1505   ALT 18 06/23/2015 0914   BILITOT  0.51 08/11/2015 0813   BILITOT 0.3 06/29/2015 1505   BILITOT 0.5 06/23/2015 0914         Impression and Plan: Cassandra Moses is A 60 year old white female with polycythemia vera. She clearly has polycythemia vera. She is JAK2 positive.   She responded very well to the Hydrea and phlebotomy. She does not need to be phlebotomized which is wonderful.  We will keep her Hydrea dose at 1000 mg a day.  We'll plan to get her back in about 8 weeks now. I feel confident that her blood counts will stabilize somewhat.    [name], MD 5/4/20179:10 AM

## 2015-10-11 ENCOUNTER — Other Ambulatory Visit: Payer: Self-pay | Admitting: Women's Health

## 2015-10-11 ENCOUNTER — Other Ambulatory Visit: Payer: Self-pay | Admitting: Hematology & Oncology

## 2015-11-09 ENCOUNTER — Other Ambulatory Visit: Payer: Self-pay | Admitting: Hematology & Oncology

## 2015-11-18 ENCOUNTER — Ambulatory Visit (HOSPITAL_BASED_OUTPATIENT_CLINIC_OR_DEPARTMENT_OTHER): Payer: Self-pay | Admitting: Hematology & Oncology

## 2015-11-18 ENCOUNTER — Other Ambulatory Visit (HOSPITAL_BASED_OUTPATIENT_CLINIC_OR_DEPARTMENT_OTHER): Payer: Self-pay

## 2015-11-18 ENCOUNTER — Ambulatory Visit: Payer: Self-pay

## 2015-11-18 ENCOUNTER — Encounter: Payer: Self-pay | Admitting: Hematology & Oncology

## 2015-11-18 VITALS — BP 138/66 | HR 100 | Temp 98.1°F | Resp 16 | Ht <= 58 in | Wt 111.0 lb

## 2015-11-18 DIAGNOSIS — D45 Polycythemia vera: Secondary | ICD-10-CM

## 2015-11-18 LAB — CBC WITH DIFFERENTIAL (CANCER CENTER ONLY)
BASO#: 0.1 10*3/uL (ref 0.0–0.2)
BASO%: 0.6 % (ref 0.0–2.0)
EOS%: 1.3 % (ref 0.0–7.0)
Eosinophils Absolute: 0.1 10*3/uL (ref 0.0–0.5)
HCT: 32.9 % — ABNORMAL LOW (ref 34.8–46.6)
HGB: 12.2 g/dL (ref 11.6–15.9)
LYMPH#: 2.1 10*3/uL (ref 0.9–3.3)
LYMPH%: 24.9 % (ref 14.0–48.0)
MCH: 47.1 pg — ABNORMAL HIGH (ref 26.0–34.0)
MCHC: 37.1 g/dL — ABNORMAL HIGH (ref 32.0–36.0)
MCV: 127 fL — ABNORMAL HIGH (ref 81–101)
MONO#: 1 10*3/uL — ABNORMAL HIGH (ref 0.1–0.9)
MONO%: 11.4 % (ref 0.0–13.0)
NEUT#: 5.3 10*3/uL (ref 1.5–6.5)
NEUT%: 61.8 % (ref 39.6–80.0)
PLATELETS: 338 10*3/uL (ref 145–400)
RBC: 2.59 10*6/uL — ABNORMAL LOW (ref 3.70–5.32)
RDW: 11.8 % (ref 11.1–15.7)
WBC: 8.5 10*3/uL (ref 3.9–10.0)

## 2015-11-18 LAB — COMPREHENSIVE METABOLIC PANEL
ALK PHOS: 86 U/L (ref 40–150)
ALT: 13 U/L (ref 0–55)
ANION GAP: 10 meq/L (ref 3–11)
AST: 17 U/L (ref 5–34)
Albumin: 3.7 g/dL (ref 3.5–5.0)
BUN: 9.3 mg/dL (ref 7.0–26.0)
CALCIUM: 9.1 mg/dL (ref 8.4–10.4)
CHLORIDE: 101 meq/L (ref 98–109)
CO2: 25 mEq/L (ref 22–29)
Creatinine: 0.8 mg/dL (ref 0.6–1.1)
EGFR: 85 mL/min/{1.73_m2} — AB (ref >90)
GLUCOSE: 102 mg/dL (ref 70–140)
POTASSIUM: 5.2 meq/L — AB (ref 3.5–5.1)
Sodium: 135 mEq/L — ABNORMAL LOW (ref 136–145)
Total Bilirubin: 0.35 mg/dL (ref 0.20–1.20)
Total Protein: 7.2 g/dL (ref 6.4–8.3)

## 2015-11-18 LAB — IRON AND TIBC
%SAT: 27 % (ref 21–57)
IRON: 74 ug/dL (ref 41–142)
TIBC: 271 ug/dL (ref 236–444)
UIBC: 198 ug/dL (ref 120–384)

## 2015-11-18 LAB — CHCC SATELLITE - SMEAR

## 2015-11-18 LAB — FERRITIN: FERRITIN: 105 ng/mL (ref 9–269)

## 2015-11-18 NOTE — Progress Notes (Signed)
No phlebotomy needed; HCT 32.9

## 2015-11-18 NOTE — Progress Notes (Signed)
Hematology and Oncology Follow Up Visit  Cassandra Moses [id number] 1956-03-27 60 y.o. 11/18/2015   Principle Diagnosis:   Polycythemia vera-JAK2 positive  Current Therapy:    Hydrea 1000 mg by mouth daily alternate w/ 500mg  po q day  Aspirin 81 mg by mouth daily  Phlebotomy to maintain hematocrit below 45%     Interim History:  Cassandra Moses is back for follow-up. She is feeling a little bit tired. She thinks it might be from the [facility]. This certainly could be the case.  She has had no problems with nausea or vomiting. She had a nice July 4 holiday.  She is still working full-time. She is a Metallurgist.  She's had no diarrhea. She's had no obvious bleeding. She's had no fever. She's had no leg swelling. She's had no rashes.  Again, she does feel tired. She does not have as much energy as she used to. I suspect that she may be iron deficient.   Overall, her performance status is ECOG 1.  Medications:  Current outpatient prescriptions:  .  FLUoxetine (PROZAC) 10 MG capsule, TAKE ONE CAPSULE BY MOUTH ONCE DAILY FOR 2 WEEKS AND THEN TAKE TWO CAPSULE ONCE DAILY IF NEEDED, Disp: 60 capsule, Rfl: 6 .  hydroxyurea (HYDREA) 500 MG capsule, TAKE ONE CAPSULE BY MOUTH TWICE DAILY WITH FOOD TO MINIMIZE GI SIDE EFFECTS, Disp: 60 capsule, Rfl: 0  Allergies: No Known Allergies  Past Medical History, Surgical history, Social history, and Family History were reviewed and updated.  Review of Systems: As above  Physical Exam:  height is 4\' 10"  (1.473 m) and weight is 111 lb (50.349 kg). Her oral temperature is 98.1 F (36.7 C). Her blood pressure is 138/66 and her pulse is 100. Her respiration is 16.   Wt Readings from Last 3 Encounters:  11/18/15 111 lb (50.349 kg)  09/15/15 111 lb (50.349 kg)  08/11/15 114 lb (51.71 kg)     Head and neck exam shows no ocular or oral lesions. She has some slight facial plethora. She has no oral lesions. Shows no adenopathy in the  neck. Pupils are reactive. The maybe some slight conjunctival inflammation. Lungs are clear. Cardiac exam is slightly tachycardic regular. She has no murmurs, rubs or bruits. Abdomen is soft. She has decent bowel sounds. There is some tenderness in the left upper quadrant. Her spleen tip might be palpable with deep inspiration. There is no liver edge. Back exam shows no tenderness over the spine, ribs or hips. Extremities shows no clubbing, cyanosis or edema. She has good range of motion of her joints. Skin exam shows a ruddy complexion to her skin. Neurological exam shows no focal neurological deficits.   Lab Results  Component Value Date   WBC 8.5 11/18/2015   HGB 12.2 11/18/2015   HCT 32.9* 11/18/2015   MCV 127* 11/18/2015   PLT 338 11/18/2015     Chemistry      Component Value Date/Time   NA 135* 11/18/2015 0759   NA 136 06/29/2015 1505   NA 136 06/23/2015 0914   K 5.2* 11/18/2015 0759   K 3.9 06/29/2015 1505   K 5.7* 06/23/2015 0914   CL 102 06/29/2015 1505   CL 102 06/23/2015 0914   CO2 25 11/18/2015 0759   CO2 24 06/29/2015 1505   CO2 18* 06/23/2015 0914   BUN 9.3 11/18/2015 0759   BUN 13 06/29/2015 1505   BUN 19 06/23/2015 0914   CREATININE 0.8 11/18/2015 0759   CREATININE  0.68 06/29/2015 1505   CREATININE 0.76 06/23/2015 0914      Component Value Date/Time   CALCIUM 9.1 11/18/2015 0759   CALCIUM 9.7 06/29/2015 1505   CALCIUM 10.3 06/23/2015 0914   ALKPHOS 86 11/18/2015 0759   ALKPHOS 105 06/29/2015 1505   ALKPHOS 98 06/23/2015 0914   AST 17 11/18/2015 0759   AST 18 06/29/2015 1505   AST 20 06/23/2015 0914   ALT 13 11/18/2015 0759   ALT 15 06/29/2015 1505   ALT 18 06/23/2015 0914   BILITOT 0.35 11/18/2015 0759   BILITOT 0.3 06/29/2015 1505   BILITOT 0.5 06/23/2015 0914         Impression and Plan: Cassandra Moses is a 60 year old white female with polycythemia vera. She clearly has polycythemia vera. She is JAK2 positive.   Her iron studies were done  today. She really is not iron deficient. Her ferritin is 105. Her iron saturation is 27%.   I think we probably can cut back on the Hydrea little bit. I would like to have her take 1000 mg a day alternating with 500 mg a day. If her platelet count goes up a little bit more, I don't think this would be a bad issue. I don't want to see her hemoglobin go down more.  Her MCV is quite high which indicates that she is taking the Hydrea.  I'll plan to get her back in another couple months. Hold, we'll find that she is feeling a little bit better with a little bit more energy.    [name], MD 7/7/201712:59 PM

## 2015-12-02 ENCOUNTER — Other Ambulatory Visit: Payer: Self-pay | Admitting: Women's Health

## 2015-12-16 ENCOUNTER — Other Ambulatory Visit: Payer: Self-pay | Admitting: Hematology & Oncology

## 2016-01-19 ENCOUNTER — Ambulatory Visit (HOSPITAL_BASED_OUTPATIENT_CLINIC_OR_DEPARTMENT_OTHER): Payer: Self-pay | Admitting: Hematology & Oncology

## 2016-01-19 ENCOUNTER — Other Ambulatory Visit: Payer: Self-pay | Admitting: Family

## 2016-01-19 ENCOUNTER — Ambulatory Visit: Payer: Self-pay

## 2016-01-19 ENCOUNTER — Encounter: Payer: Self-pay | Admitting: Hematology & Oncology

## 2016-01-19 ENCOUNTER — Other Ambulatory Visit (HOSPITAL_BASED_OUTPATIENT_CLINIC_OR_DEPARTMENT_OTHER): Payer: Self-pay

## 2016-01-19 VITALS — BP 142/85 | HR 92 | Temp 98.1°F | Resp 16 | Ht <= 58 in | Wt 116.0 lb

## 2016-01-19 DIAGNOSIS — D45 Polycythemia vera: Secondary | ICD-10-CM

## 2016-01-19 LAB — IRON AND TIBC
%SAT: 40 % (ref 21–57)
Iron: 123 ug/dL (ref 41–142)
TIBC: 304 ug/dL (ref 236–444)
UIBC: 181 ug/dL (ref 120–384)

## 2016-01-19 LAB — COMPREHENSIVE METABOLIC PANEL
ALT: 16 U/L (ref 0–55)
AST: 21 U/L (ref 5–34)
Albumin: 3.8 g/dL (ref 3.5–5.0)
Alkaline Phosphatase: 94 U/L (ref 40–150)
Anion Gap: 9 mEq/L (ref 3–11)
BILIRUBIN TOTAL: 0.49 mg/dL (ref 0.20–1.20)
BUN: 12 mg/dL (ref 7.0–26.0)
CHLORIDE: 97 meq/L — AB (ref 98–109)
CO2: 25 meq/L (ref 22–29)
CREATININE: 0.8 mg/dL (ref 0.6–1.1)
Calcium: 9.1 mg/dL (ref 8.4–10.4)
EGFR: 88 mL/min/{1.73_m2} — ABNORMAL LOW (ref >90)
GLUCOSE: 95 mg/dL (ref 70–140)
Potassium: 4.4 mEq/L (ref 3.5–5.1)
SODIUM: 131 meq/L — AB (ref 136–145)
TOTAL PROTEIN: 7.3 g/dL (ref 6.4–8.3)

## 2016-01-19 LAB — CBC WITH DIFFERENTIAL (CANCER CENTER ONLY)
BASO#: 0 10*3/uL (ref 0.0–0.2)
BASO%: 0.5 % (ref 0.0–2.0)
EOS ABS: 0.1 10*3/uL (ref 0.0–0.5)
EOS%: 0.9 % (ref 0.0–7.0)
HCT: 38.4 % (ref 34.8–46.6)
HEMOGLOBIN: 14.2 g/dL (ref 11.6–15.9)
LYMPH#: 2.3 10*3/uL (ref 0.9–3.3)
LYMPH%: 26.5 % (ref 14.0–48.0)
MCH: 44.8 pg — AB (ref 26.0–34.0)
MCHC: 37 g/dL — AB (ref 32.0–36.0)
MCV: 121 fL — ABNORMAL HIGH (ref 81–101)
MONO#: 1.2 10*3/uL — ABNORMAL HIGH (ref 0.1–0.9)
MONO%: 13.4 % — AB (ref 0.0–13.0)
NEUT%: 58.7 % (ref 39.6–80.0)
NEUTROS ABS: 5 10*3/uL (ref 1.5–6.5)
PLATELETS: 270 10*3/uL (ref 145–400)
RBC: 3.17 10*6/uL — ABNORMAL LOW (ref 3.70–5.32)
RDW: 10.4 % — AB (ref 11.1–15.7)
WBC: 8.6 10*3/uL (ref 3.9–10.0)

## 2016-01-19 LAB — FERRITIN: Ferritin: 51 ng/ml (ref 9–269)

## 2016-01-19 LAB — CHCC SATELLITE - SMEAR

## 2016-01-19 NOTE — Progress Notes (Signed)
No treatment today per Dr. Marin Olp orders. HCT 38.4

## 2016-01-19 NOTE — Progress Notes (Signed)
Hematology and Oncology Follow Up Visit  Cassandra Moses [id number] 10-Aug-1955 60 y.o. 01/19/2016   Principle Diagnosis:   Polycythemia vera-JAK2 positive  Current Therapy:    Hydrea  500mg  po q day  Aspirin 81 mg by mouth daily  Phlebotomy to maintain hematocrit below 45%     Interim History:  [name] is back for follow-up. She is doing a little better. We adjusted her Hydrea dose a little bit. She has had a little bit of a difficulty with making sure she alternates 1000 mg with 500 mg. I think to make it easier for her, we will have her just take 500 mg.  She has a little more energy. We last saw her in July, her ferritin was 105 with iron saturation of 27%.   She still has some pain in the left upper quadrant. I'm not sure this is her spleen. I don't think she has any clinical splenomegaly. We may have to get another ultrasound so we can see what is going on.   She has had no change in bowel or bladder habits. She has had no fever. She has had no rashes. She is exercising.   Overall, her performance status is ECOG 1.  Medications:  Current Outpatient Prescriptions:  .  FLUoxetine (PROZAC) 10 MG capsule, TAKE ONE CAPSULE BY MOUTH ONCE DAILY FOR 2 WEEKS AND THEN TAKE TWO CAPSULE ONCE DAILY IF NEEDED, Disp: 60 capsule, Rfl: 6 .  hydroxyurea (HYDREA) 500 MG capsule, TAKE ONE CAPSULE BY MOUTH TWICE DAILY WITH FOOD TO MINIMIZE GI SIDE EFFECTS, Disp: 60 capsule, Rfl: 0  Allergies: No Known Allergies  Past Medical History, Surgical history, Social history, and Family History were reviewed and updated.  Review of Systems: As above  Physical Exam:  height is 4\' 10"  (1.473 m) and weight is 116 lb (52.6 kg). Her temperature is 98.1 F (36.7 C). Her blood pressure is 142/85 (abnormal) and her pulse is 92. Her respiration is 16.   Wt Readings from Last 3 Encounters:  01/19/16 116 lb (52.6 kg)  11/18/15 111 lb (50.3 kg)  09/15/15 111 lb (50.3 kg)     Head and neck  exam shows no ocular or oral lesions. She has some slight facial plethora. She has no oral lesions. Shows no adenopathy in the neck. Pupils are reactive. The maybe some slight conjunctival inflammation. Lungs are clear. Cardiac exam is slightly tachycardic regular. She has no murmurs, rubs or bruits. Abdomen is soft. She has decent bowel sounds. There is some tenderness in the left upper quadrant. Her spleen tip might be palpable with deep inspiration. There is no liver edge. Back exam shows no tenderness over the spine, ribs or hips. Extremities shows no clubbing, cyanosis or edema. She has good range of motion of her joints. Skin exam shows a ruddy complexion to her skin. Neurological exam shows no focal neurological deficits.   Lab Results  Component Value Date   WBC 8.6 01/19/2016   HGB 14.2 01/19/2016   HCT 38.4 01/19/2016   MCV 121 (H) 01/19/2016   PLT 270 01/19/2016     Chemistry      Component Value Date/Time   NA 135 (L) 11/18/2015 0759   K 5.2 (H) 11/18/2015 0759   CL 102 06/29/2015 1505   CO2 25 11/18/2015 0759   BUN 9.3 11/18/2015 0759   CREATININE 0.8 11/18/2015 0759      Component Value Date/Time   CALCIUM 9.1 11/18/2015 0759   ALKPHOS 86 11/18/2015  0759   AST 17 11/18/2015 0759   ALT 13 11/18/2015 0759   BILITOT 0.35 11/18/2015 0759         Impression and Plan: [name] is a 60 year old white female with polycythemia vera. She clearly has polycythemia vera. She is JAK2 positive.   Again, we will cut her Hydrea dose down to 500 mg a day. This will make it a little easier for her.  We will also set her up with another ultrasound. I want to see what her spleen looks like. I did this would be reasonable.  I will plan to get her back in about 6 weeks. I think this would be a good time to follow-up since we are changing her Hydrea dose.  I do not see need for a phlebotomy right now. I do not see a need to switch her over to [place].    [name],  MD 9/7/201711:43 AM

## 2016-02-08 ENCOUNTER — Other Ambulatory Visit: Payer: Self-pay | Admitting: Hematology & Oncology

## 2016-03-01 ENCOUNTER — Other Ambulatory Visit (HOSPITAL_BASED_OUTPATIENT_CLINIC_OR_DEPARTMENT_OTHER): Payer: Self-pay

## 2016-03-01 ENCOUNTER — Ambulatory Visit (HOSPITAL_BASED_OUTPATIENT_CLINIC_OR_DEPARTMENT_OTHER): Payer: Self-pay | Admitting: Hematology & Oncology

## 2016-03-01 ENCOUNTER — Encounter: Payer: Self-pay | Admitting: Hematology & Oncology

## 2016-03-01 ENCOUNTER — Encounter: Payer: Self-pay | Admitting: Oncology

## 2016-03-01 VITALS — BP 141/83 | HR 96 | Temp 98.3°F | Resp 16 | Ht <= 58 in | Wt 117.0 lb

## 2016-03-01 DIAGNOSIS — D45 Polycythemia vera: Secondary | ICD-10-CM

## 2016-03-01 LAB — CBC WITH DIFFERENTIAL (CANCER CENTER ONLY)
BASO#: 0.1 10*3/uL (ref 0.0–0.2)
BASO%: 0.5 % (ref 0.0–2.0)
EOS ABS: 0.1 10*3/uL (ref 0.0–0.5)
EOS%: 0.9 % (ref 0.0–7.0)
HCT: 41.8 % (ref 34.8–46.6)
HEMOGLOBIN: 15.2 g/dL (ref 11.6–15.9)
LYMPH#: 2.5 10*3/uL (ref 0.9–3.3)
LYMPH%: 24.7 % (ref 14.0–48.0)
MCH: 42.3 pg — ABNORMAL HIGH (ref 26.0–34.0)
MCHC: 36.4 g/dL — ABNORMAL HIGH (ref 32.0–36.0)
MCV: 116 fL — ABNORMAL HIGH (ref 81–101)
MONO#: 1.3 10*3/uL — ABNORMAL HIGH (ref 0.1–0.9)
MONO%: 12.4 % (ref 0.0–13.0)
NEUT%: 61.5 % (ref 39.6–80.0)
NEUTROS ABS: 6.3 10*3/uL (ref 1.5–6.5)
PLATELETS: 348 10*3/uL (ref 145–400)
RBC: 3.59 10*6/uL — AB (ref 3.70–5.32)
RDW: 11 % — ABNORMAL LOW (ref 11.1–15.7)
WBC: 10.3 10*3/uL — AB (ref 3.9–10.0)

## 2016-03-01 LAB — IRON AND TIBC
%SAT: 33 % (ref 21–57)
IRON: 110 ug/dL (ref 41–142)
TIBC: 331 ug/dL (ref 236–444)
UIBC: 221 ug/dL (ref 120–384)

## 2016-03-01 LAB — COMPREHENSIVE METABOLIC PANEL
ALBUMIN: 4 g/dL (ref 3.5–5.0)
ALK PHOS: 99 U/L (ref 40–150)
ALT: 14 U/L (ref 0–55)
AST: 21 U/L (ref 5–34)
Anion Gap: 12 mEq/L — ABNORMAL HIGH (ref 3–11)
BILIRUBIN TOTAL: 0.42 mg/dL (ref 0.20–1.20)
BUN: 15.1 mg/dL (ref 7.0–26.0)
CALCIUM: 9.7 mg/dL (ref 8.4–10.4)
CO2: 24 mEq/L (ref 22–29)
Chloride: 103 mEq/L (ref 98–109)
Creatinine: 0.8 mg/dL (ref 0.6–1.1)
EGFR: 85 mL/min/{1.73_m2} — AB (ref >90)
GLUCOSE: 100 mg/dL (ref 70–140)
POTASSIUM: 4.3 meq/L (ref 3.5–5.1)
Sodium: 138 mEq/L (ref 136–145)
TOTAL PROTEIN: 7.9 g/dL (ref 6.4–8.3)

## 2016-03-01 LAB — FERRITIN: FERRITIN: 48 ng/mL (ref 9–269)

## 2016-03-01 LAB — CHCC SATELLITE - SMEAR

## 2016-03-01 LAB — LACTATE DEHYDROGENASE: LDH: 226 U/L (ref 125–245)

## 2016-03-01 NOTE — Progress Notes (Signed)
Hematology and Oncology Follow Up Visit  Cassandra Moses [id number] 1955-11-09 60 y.o. 03/01/2016   Principle Diagnosis:   Polycythemia vera-JAK2 positive  Current Therapy:    Hydrea  500mg  po q day  Aspirin 81 mg by mouth daily  Phlebotomy to maintain hematocrit below 45%     Interim History:  Cassandra Moses is back for follow-up. She is doing better. She has had no problems with the decrease in Hydrea dose.  She's had no pruritus. She's had no cough or shortness of breath. She's had no change in bowel or bladder habits.  She is still working. She is quite busy at work.  She's had no weight loss waking. She is still exercising a little bit.  She's had no headache. She's had no fever. She's had no nausea or vomiting.  Overall, her performance status is ECOG 1.  Medications:  Current Outpatient Prescriptions:  .  FLUoxetine (PROZAC) 10 MG capsule, TAKE ONE CAPSULE BY MOUTH ONCE DAILY FOR 2 WEEKS AND THEN TAKE TWO CAPSULE ONCE DAILY IF NEEDED, Disp: 60 capsule, Rfl: 6 .  hydroxyurea (HYDREA) 500 MG capsule, TAKE ONE CAPSULE BY MOUTH TWICE DAILY WITH FOOD TO MINIMIZE GI SIDE EFFECTS, Disp: 60 capsule, Rfl: 0  Allergies: No Known Allergies  Past Medical History, Surgical history, Social history, and Family History were reviewed and updated.  Review of Systems: As above  Physical Exam:  height is 4\' 10"  (1.473 m) and weight is 117 lb (53.1 kg). Her oral temperature is 98.3 F (36.8 C). Her blood pressure is 141/83 (abnormal) and her pulse is 96. Her respiration is 16.   Wt Readings from Last 3 Encounters:  03/01/16 117 lb (53.1 kg)  01/19/16 116 lb (52.6 kg)  11/18/15 111 lb (50.3 kg)     Head and neck exam shows no ocular or oral lesions. She has some slight facial plethora. She has no oral lesions. Shows no adenopathy in the neck. Pupils are reactive. The maybe some slight conjunctival inflammation. Lungs are clear. Cardiac exam is slightly tachycardic regular.  She has no murmurs, rubs or bruits. Abdomen is soft. She has decent bowel sounds. There is some tenderness in the left upper quadrant. Her spleen tip might be palpable with deep inspiration. There is no liver edge. Back exam shows no tenderness over the spine, ribs or hips. Extremities shows no clubbing, cyanosis or edema. She has good range of motion of her joints. Skin exam shows a ruddy complexion to her skin. Neurological exam shows no focal neurological deficits.   Lab Results  Component Value Date   WBC 10.3 (H) 03/01/2016   HGB 15.2 03/01/2016   HCT 41.8 03/01/2016   MCV 116 (H) 03/01/2016   PLT 348 03/01/2016     Chemistry      Component Value Date/Time   NA 131 (L) 01/19/2016 1000   K 4.4 01/19/2016 1000   CL 102 06/29/2015 1505   CO2 25 01/19/2016 1000   BUN 12.0 01/19/2016 1000   CREATININE 0.8 01/19/2016 1000      Component Value Date/Time   CALCIUM 9.1 01/19/2016 1000   ALKPHOS 94 01/19/2016 1000   AST 21 01/19/2016 1000   ALT 16 01/19/2016 1000   BILITOT 0.49 01/19/2016 1000         Impression and Plan: [name] is a 60 year old white female with polycythemia vera. She clearly has polycythemia vera. She is JAK2 positive.   For right now, I think we probably get her back  in 3 months. I'll plan to get her back after the holidays. I think her blood count is doing quite well. I'll see her platelet go up all back quickly.  We are still making her iron deficient.  Her quality of life is doing quite well right now.  She has such a good attitude. She has a very strong constitution.    Cassandra Napoleon, MD 10/19/201712:24 PM

## 2016-04-21 ENCOUNTER — Other Ambulatory Visit: Payer: Self-pay | Admitting: Hematology & Oncology

## 2016-05-30 ENCOUNTER — Other Ambulatory Visit: Payer: Self-pay

## 2016-05-30 ENCOUNTER — Ambulatory Visit: Payer: Self-pay | Admitting: Hematology & Oncology

## 2016-06-25 ENCOUNTER — Ambulatory Visit (HOSPITAL_BASED_OUTPATIENT_CLINIC_OR_DEPARTMENT_OTHER): Payer: Self-pay | Admitting: Hematology & Oncology

## 2016-06-25 ENCOUNTER — Encounter: Payer: BLUE CROSS/BLUE SHIELD | Admitting: Women's Health

## 2016-06-25 ENCOUNTER — Other Ambulatory Visit (HOSPITAL_BASED_OUTPATIENT_CLINIC_OR_DEPARTMENT_OTHER): Payer: Self-pay

## 2016-06-25 VITALS — BP 151/69 | HR 116 | Temp 98.4°F | Wt 116.0 lb

## 2016-06-25 DIAGNOSIS — D45 Polycythemia vera: Secondary | ICD-10-CM

## 2016-06-25 LAB — CBC WITH DIFFERENTIAL (CANCER CENTER ONLY)
BASO#: 0.1 10*3/uL (ref 0.0–0.2)
BASO%: 0.5 % (ref 0.0–2.0)
EOS ABS: 0.2 10*3/uL (ref 0.0–0.5)
EOS%: 1.3 % (ref 0.0–7.0)
HCT: 44.2 % (ref 34.8–46.6)
HEMOGLOBIN: 16.4 g/dL — AB (ref 11.6–15.9)
LYMPH#: 3.3 10*3/uL (ref 0.9–3.3)
LYMPH%: 27.6 % (ref 14.0–48.0)
MCH: 40.9 pg — ABNORMAL HIGH (ref 26.0–34.0)
MCHC: 37.1 g/dL — ABNORMAL HIGH (ref 32.0–36.0)
MCV: 110 fL — ABNORMAL HIGH (ref 81–101)
MONO#: 1 10*3/uL — ABNORMAL HIGH (ref 0.1–0.9)
MONO%: 8.4 % (ref 0.0–13.0)
NEUT%: 62.2 % (ref 39.6–80.0)
NEUTROS ABS: 7.5 10*3/uL — AB (ref 1.5–6.5)
PLATELETS: 460 10*3/uL — AB (ref 145–400)
RBC: 4.01 10*6/uL (ref 3.70–5.32)
RDW: 12 % (ref 11.1–15.7)
WBC: 12.1 10*3/uL — ABNORMAL HIGH (ref 3.9–10.0)

## 2016-06-25 LAB — TECHNOLOGIST REVIEW CHCC SATELLITE

## 2016-06-25 LAB — COMPREHENSIVE METABOLIC PANEL
ALT: 16 U/L (ref 0–55)
AST: 21 U/L (ref 5–34)
Albumin: 4.1 g/dL (ref 3.5–5.0)
Alkaline Phosphatase: 96 U/L (ref 40–150)
Anion Gap: 10 mEq/L (ref 3–11)
BILIRUBIN TOTAL: 0.44 mg/dL (ref 0.20–1.20)
BUN: 10.3 mg/dL (ref 7.0–26.0)
CHLORIDE: 104 meq/L (ref 98–109)
CO2: 23 meq/L (ref 22–29)
Calcium: 9.7 mg/dL (ref 8.4–10.4)
Creatinine: 0.9 mg/dL (ref 0.6–1.1)
EGFR: 71 mL/min/{1.73_m2} — AB (ref >90)
GLUCOSE: 136 mg/dL (ref 70–140)
POTASSIUM: 3.9 meq/L (ref 3.5–5.1)
SODIUM: 138 meq/L (ref 136–145)
TOTAL PROTEIN: 7.6 g/dL (ref 6.4–8.3)

## 2016-06-25 LAB — CHCC SATELLITE - SMEAR

## 2016-06-25 LAB — LACTATE DEHYDROGENASE: LDH: 252 U/L — AB (ref 125–245)

## 2016-06-25 NOTE — Progress Notes (Signed)
Hematology and Oncology Follow Up Visit  Cassandra Moses [id number] June 29, 1955 61 y.o. 06/25/2016   Principle Diagnosis:   Polycythemia vera-JAK2 positive  Current Therapy:    Hydrea  500mg  po q day, alternating with 1000 mg daily  Aspirin 81 mg by mouth daily  Phlebotomy to maintain hematocrit below 45%     Interim History:  Cassandra Moses is back for follow-up. She is doing okay. 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  She is doing well with the Hydrea. We might have to increase the dose to 1000 mg daily alternating with 500 mg daily. Her platelet count is going up a little bit.  She's had no headache. She's had no pruritus. She's had no rashes. There's been no change in bowel or bladder habits.  She's doing well with aspirin. There is no bleeding.  Overall, her performance status is ECOG 1.  Medications:  Current Outpatient Prescriptions:  .  FLUoxetine (PROZAC) 10 MG capsule, TAKE ONE CAPSULE BY MOUTH ONCE DAILY FOR 2 WEEKS AND THEN TAKE TWO CAPSULE ONCE DAILY IF NEEDED (Patient not taking: Reported on 06/25/2016), Disp: 60 capsule, Rfl: 6 .  hydroxyurea (HYDREA) 500 MG capsule, TAKE ONE CAPSULE BY MOUTH TWICE DAILY WITH FOOD TO MINIMIZE GI SIDE EFFECTS, Disp: 60 capsule, Rfl: 0  Allergies: No Known Allergies  Past Medical History, Surgical history, Social history, and Family History were reviewed and updated.  Review of Systems: As above  Physical Exam:  weight is 116 lb (52.6 kg). Her oral temperature is 98.4 F (36.9 C). Her blood pressure is 151/69 (abnormal) and her pulse is 116 (abnormal).   Wt Readings from Last 3 Encounters:  06/25/16 116 lb (52.6 kg)  03/01/16 117 lb (53.1 kg)  01/19/16 116 lb (52.6 kg)     Head and neck exam shows no ocular or oral lesions. She has some slight facial plethora. She has no oral lesions. Shows no adenopathy in the neck. Pupils are  reactive. The maybe some slight conjunctival inflammation. Lungs are clear. Cardiac exam is slightly tachycardic regular. She has no murmurs, rubs or bruits. Abdomen is soft. She has decent bowel sounds. There is some tenderness in the left upper quadrant. Her spleen tip might be palpable with deep inspiration. There is no liver edge. Back exam shows no tenderness over the spine, ribs or hips. Extremities shows no clubbing, cyanosis or edema. She has good range of motion of her joints. Skin exam shows a ruddy complexion to her skin. Neurological exam shows no focal neurological deficits.   Lab Results  Component Value Date   WBC 12.1 (H) 06/25/2016   HGB 16.4 (H) 06/25/2016   HCT 44.2 06/25/2016   MCV 110 (H) 06/25/2016   PLT 460 (H) 06/25/2016     Chemistry      Component Value Date/Time   NA 138 03/01/2016 1124   K 4.3 03/01/2016 1124   CL 102 06/29/2015 1505   CO2 24 03/01/2016 1124   BUN 15.1 03/01/2016 1124   CREATININE 0.8 03/01/2016 1124      Component Value Date/Time   CALCIUM 9.7 03/01/2016 1124   ALKPHOS 99 03/01/2016 1124   AST 21 03/01/2016 1124   ALT 14 03/01/2016 1124   BILITOT 0.42 03/01/2016 1124         Impression and Plan: Cassandra Moses is a 61 year old white female with polycythemia vera. She  clearly has polycythemia vera. She is JAK2 positive.   Since we are changing her dose of Hydrea, I will see her back in 3 months.  She does not need to be phlebotomized today. has such a good attitude.  She is very kind and gave me a present from her sister. It is pickled okra.   [name], MD 2/12/20181:11 PM

## 2016-06-26 ENCOUNTER — Encounter: Payer: BLUE CROSS/BLUE SHIELD | Admitting: Women's Health

## 2016-06-26 ENCOUNTER — Other Ambulatory Visit: Payer: Self-pay | Admitting: Hematology & Oncology

## 2016-06-26 LAB — IRON AND TIBC
%SAT: 36 % (ref 21–57)
Iron: 117 ug/dL (ref 41–142)
TIBC: 324 ug/dL (ref 236–444)
UIBC: 206 ug/dL (ref 120–384)

## 2016-06-26 LAB — FERRITIN: FERRITIN: 35 ng/mL (ref 9–269)

## 2016-09-24 ENCOUNTER — Ambulatory Visit (HOSPITAL_BASED_OUTPATIENT_CLINIC_OR_DEPARTMENT_OTHER): Payer: Self-pay | Admitting: Hematology & Oncology

## 2016-09-24 ENCOUNTER — Other Ambulatory Visit (HOSPITAL_BASED_OUTPATIENT_CLINIC_OR_DEPARTMENT_OTHER): Payer: Self-pay

## 2016-09-24 VITALS — BP 142/81 | HR 102 | Temp 97.7°F | Resp 17 | Wt 116.0 lb

## 2016-09-24 DIAGNOSIS — D45 Polycythemia vera: Secondary | ICD-10-CM

## 2016-09-24 LAB — CMP (CANCER CENTER ONLY)
ALBUMIN: 4.1 g/dL (ref 3.3–5.5)
ALT(SGPT): 14 U/L (ref 10–47)
AST: 24 U/L (ref 11–38)
Alkaline Phosphatase: 97 U/L — ABNORMAL HIGH (ref 26–84)
BUN, Bld: 9 mg/dL (ref 7–22)
CO2: 26 mEq/L (ref 18–33)
Calcium: 9.6 mg/dL (ref 8.0–10.3)
Chloride: 105 mEq/L (ref 98–108)
Creat: 0.6 mg/dl (ref 0.6–1.2)
Glucose, Bld: 118 mg/dL (ref 73–118)
POTASSIUM: 4 meq/L (ref 3.3–4.7)
Sodium: 137 mEq/L (ref 128–145)
TOTAL PROTEIN: 7.8 g/dL (ref 6.4–8.1)
Total Bilirubin: 0.7 mg/dl (ref 0.20–1.60)

## 2016-09-24 LAB — FERRITIN: FERRITIN: 61 ng/mL (ref 9–269)

## 2016-09-24 LAB — CBC WITH DIFFERENTIAL (CANCER CENTER ONLY)
BASO#: 0.1 10*3/uL (ref 0.0–0.2)
BASO%: 0.8 % (ref 0.0–2.0)
EOS%: 1.5 % (ref 0.0–7.0)
Eosinophils Absolute: 0.2 10*3/uL (ref 0.0–0.5)
HCT: 42.3 % (ref 34.8–46.6)
HGB: 15.2 g/dL (ref 11.6–15.9)
LYMPH#: 2.4 10*3/uL (ref 0.9–3.3)
LYMPH%: 24 % (ref 14.0–48.0)
MCH: 40.5 pg — ABNORMAL HIGH (ref 26.0–34.0)
MCHC: 35.9 g/dL (ref 32.0–36.0)
MCV: 113 fL — ABNORMAL HIGH (ref 81–101)
MONO#: 1.1 10*3/uL — AB (ref 0.1–0.9)
MONO%: 11 % (ref 0.0–13.0)
NEUT%: 62.7 % (ref 39.6–80.0)
NEUTROS ABS: 6.2 10*3/uL (ref 1.5–6.5)
RBC: 3.75 10*6/uL (ref 3.70–5.32)
RDW: 12.4 % (ref 11.1–15.7)
WBC: 9.9 10*3/uL (ref 3.9–10.0)

## 2016-09-24 LAB — IRON AND TIBC
%SAT: 46 % (ref 21–57)
IRON: 140 ug/dL (ref 41–142)
TIBC: 303 ug/dL (ref 236–444)
UIBC: 163 ug/dL (ref 120–384)

## 2016-09-24 LAB — LACTATE DEHYDROGENASE: LDH: 212 U/L (ref 125–245)

## 2016-09-24 NOTE — Progress Notes (Signed)
Hematology and Oncology Follow Up Visit  Cassandra Moses [id number] August 26, 1955 61 y.o. 09/24/2016   Principle Diagnosis:   Polycythemia vera-JAK2 positive  Current Therapy:    Hydrea  500mg  po q day, alternating with 1000 mg daily  Aspirin 81 mg by mouth daily  Phlebotomy to maintain hematocrit below 45%     Interim History:  [name] is back for follow-up. She is doing okay. She is busy. She had a busy Mother's Day weekend. Her mother came over to her house.  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  She's had no problems with the increased Hydrea dose. There is no nausea or vomiting. She's having no diarrhea. She's having no bleeding. There's been no leg swelling. She's had no rashes.  Of note, back in February, her iron studies showed a ferritin of 35 with an iron saturation of 36%.  Overall, her performance status is ECOG 1.  Medications:  Current Outpatient Prescriptions:  .  hydroxyurea (HYDREA) 500 MG capsule, TAKE ONE CAPSULE BY MOUTH TWICE DAILY WITH FOOD TO  MINIMIZE  GI  SIDE  EFFECTS, Disp: 60 capsule, Rfl: 4  Allergies: No Known Allergies  Past Medical History, Surgical history, Social history, and Family History were reviewed and updated.  Review of Systems: As above  Physical Exam:  weight is 116 lb (52.6 kg). Her oral temperature is 97.7 F (36.5 C). Her blood pressure is 142/81 (abnormal) and her pulse is 102 (abnormal). Her respiration is 17 and oxygen saturation is 98%.   Wt Readings from Last 3 Encounters:  09/24/16 116 lb (52.6 kg)  06/25/16 116 lb (52.6 kg)  03/01/16 117 lb (53.1 kg)     Head and neck exam shows no ocular or oral lesions. She has some slight facial plethora. She has no oral lesions. Shows no adenopathy in the neck. Pupils are reactive. The maybe some slight conjunctival inflammation. Lungs are clear. Cardiac exam is slightly tachycardic regular. She has no murmurs, rubs or bruits. Abdomen is soft. She  has decent bowel sounds. There is some tenderness in the left upper quadrant. Her spleen tip might be palpable with deep inspiration. There is no liver edge. Back exam shows no tenderness over the spine, ribs or hips. Extremities shows no clubbing, cyanosis or edema. She has good range of motion of her joints. Skin exam shows a ruddy complexion to her skin. Neurological exam shows no focal neurological deficits.   Lab Results  Component Value Date   WBC 9.9 09/24/2016   HGB 15.2 09/24/2016   HCT 42.3 09/24/2016   MCV 113 (H) 09/24/2016   PLT 414 Platelet count consistent in citrate (H) 09/24/2016     Chemistry      Component Value Date/Time   NA 138 06/25/2016 1159   K 3.9 06/25/2016 1159   CL 102 06/29/2015 1505   CO2 23 06/25/2016 1159   BUN 10.3 06/25/2016 1159   CREATININE 0.9 06/25/2016 1159      Component Value Date/Time   CALCIUM 9.7 06/25/2016 1159   ALKPHOS 96 06/25/2016 1159   AST 21 06/25/2016 1159   ALT 16 06/25/2016 1159   BILITOT 0.44 06/25/2016 1159         Impression and Plan: [name] is a 61 year old white female with polycythemia vera. She clearly has polycythemia vera. She is JAK2 positive.   Her platelet count is doing better. As such, I think we can probably get her back in 4 months.  Today [birth year] her birthday. She will have a wonderful day.   [name], MD 5/14/201810:43 AM

## 2017-01-13 IMAGING — US US ABDOMEN COMPLETE
1 series · 13 of 25 positions shown · non-contrast
Comparison: None.

CLINICAL DATA: Left upper quadrant pain for 6-9 months.

EXAM:
ABDOMEN ULTRASOUND COMPLETE

[Series 1: us abdomen complete · 0.12mm/px · 13 of 110 slices shown]
[im 1/110]
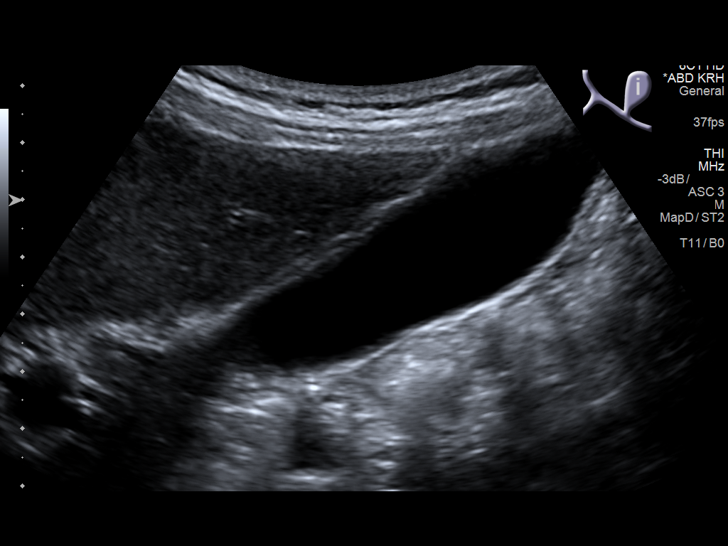
[im 10/110]
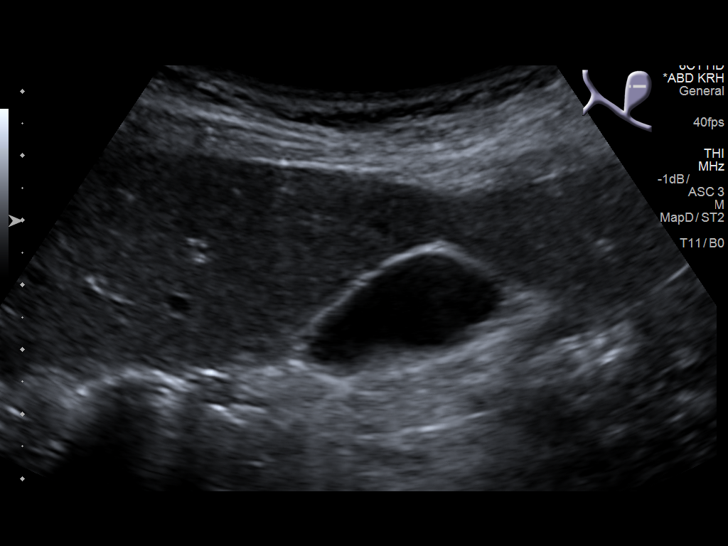
[im 19/110]
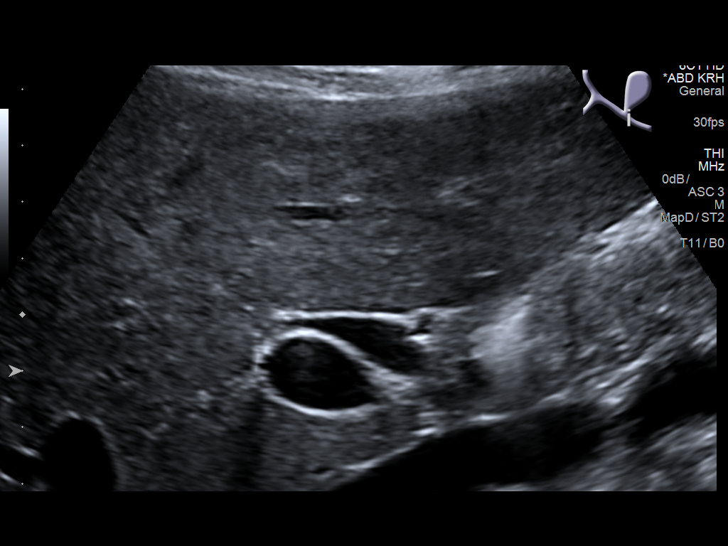
[im 28/110]
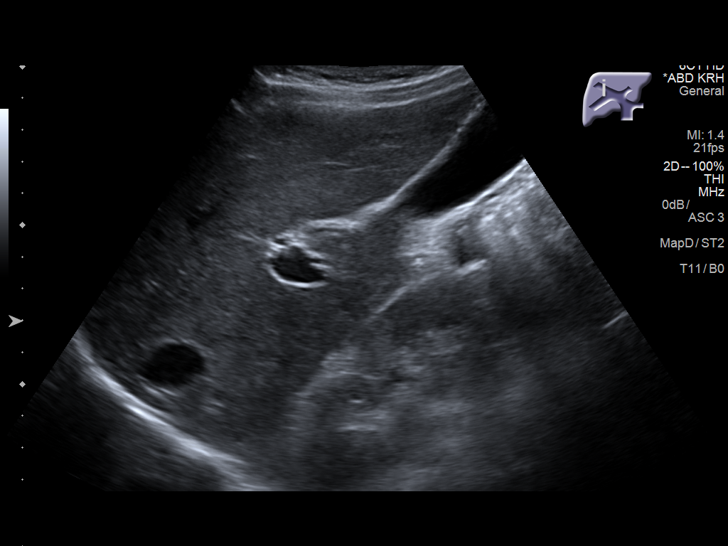
[im 37/110]
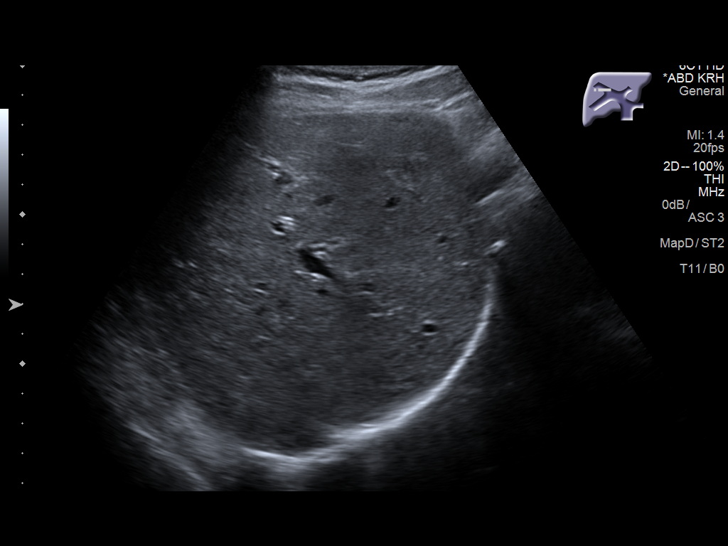
[im 46/110]
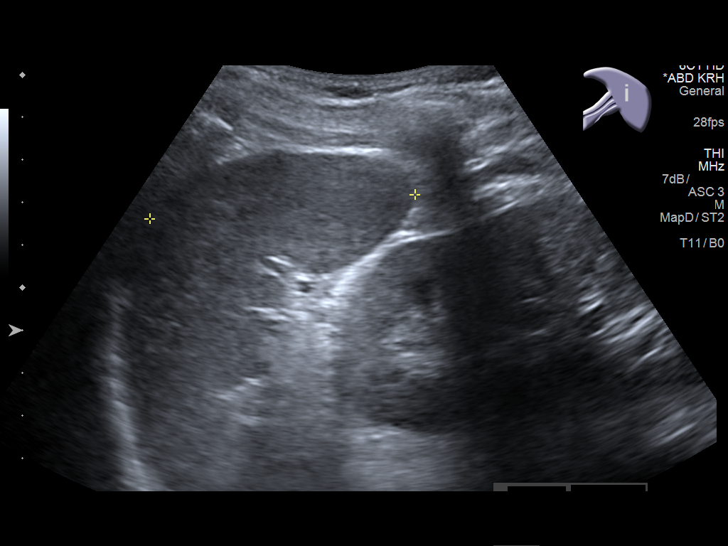
[im 55/110]
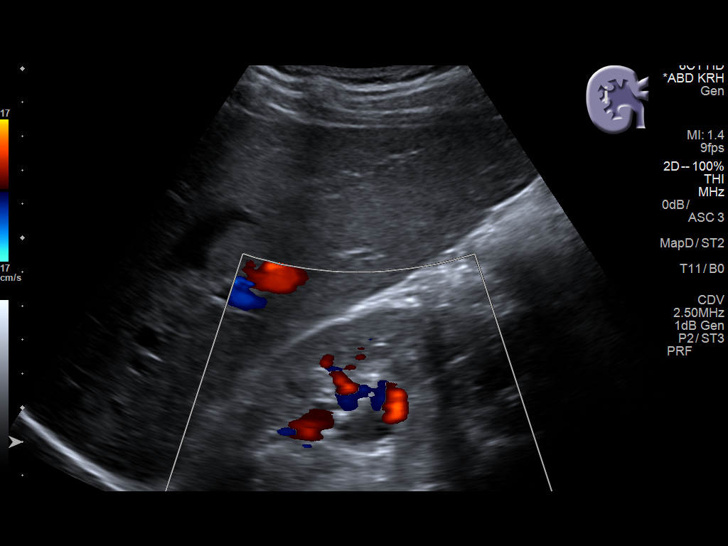
[im 64/110]
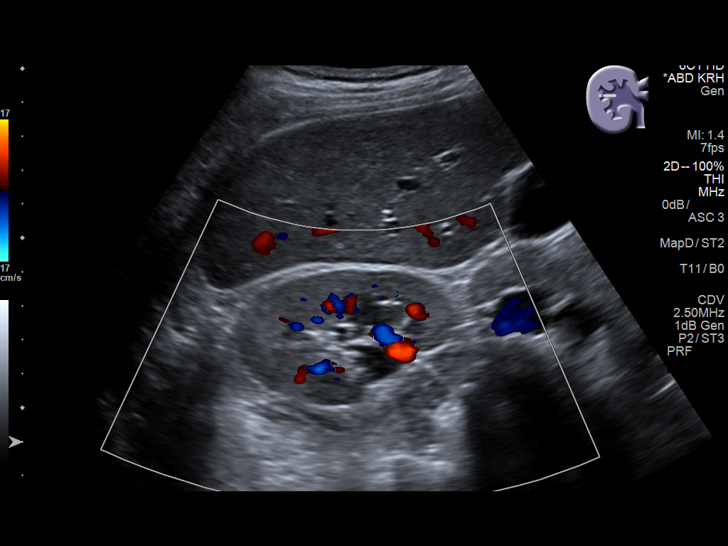
[im 73/110]
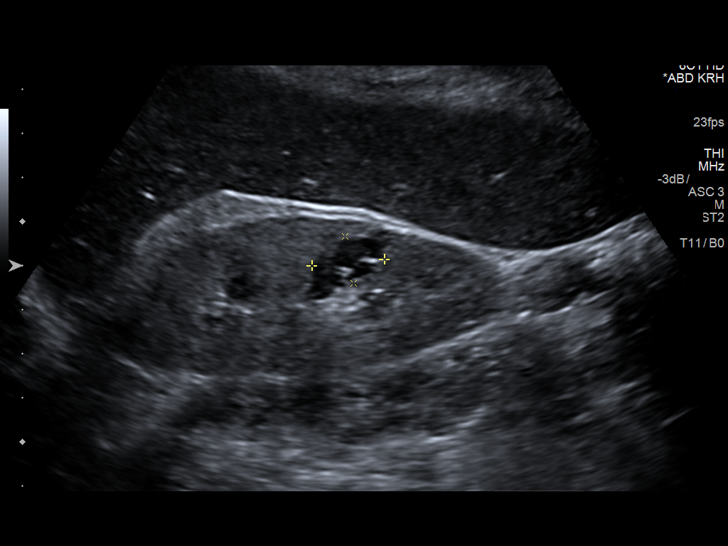
[im 82/110]
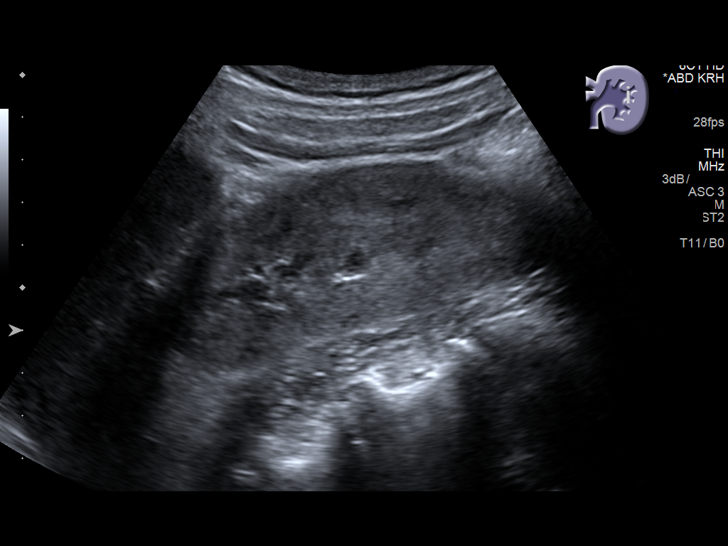
[im 91/110]
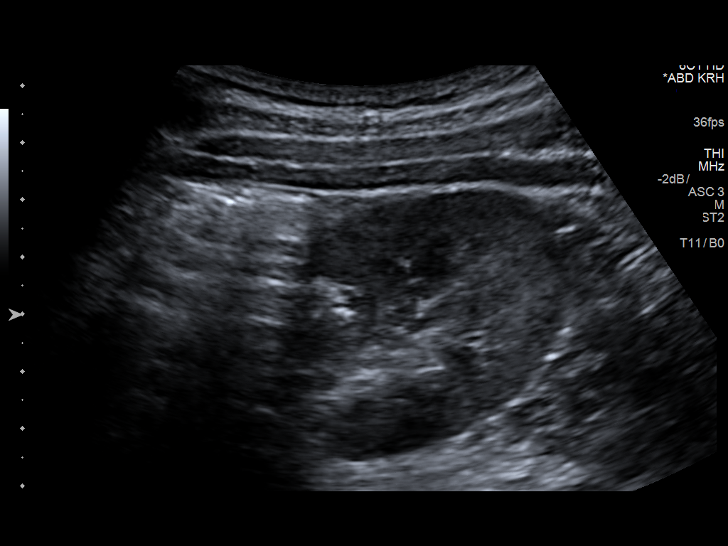
[im 100/110]
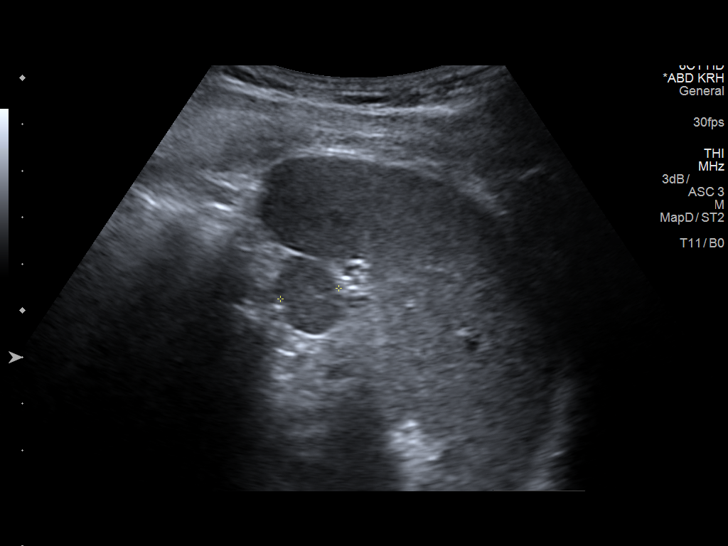
[im 110/110]
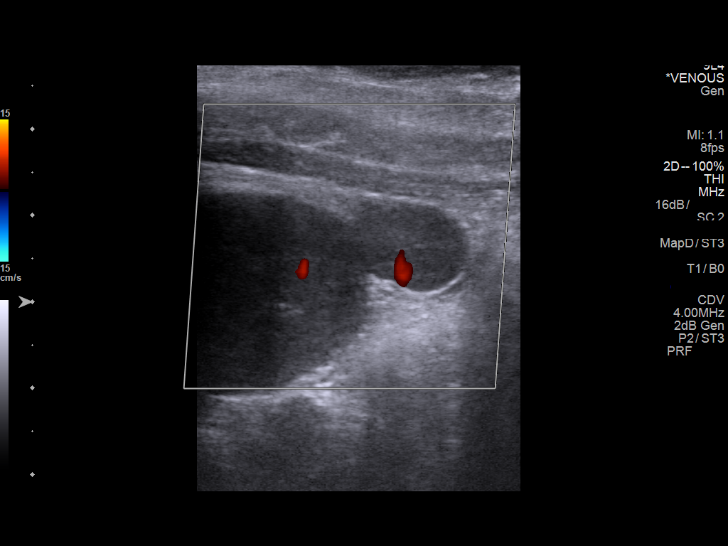

[13 of 25 positions shown; findings below may reference images not displayed]

FINDINGS: Gallbladder: No gallstones or wall thickening visualized. No
sonographic Murphy sign noted by sonographer.

Common bile duct: Diameter: 5 mm

Liver: No focal lesion identified. Within normal limits in
parenchymal echogenicity.

IVC: Limited visualization secondary to overlying bowel gas.

Pancreas: Visualized portion unremarkable.

Spleen: Normal in size. No focal abnormality. Two adjacent isoechoic
nodular areas likely reflecting splenules.

Right Kidney: Length: 11.3 cm. 11 mm echogenic focus in the inferior
pole likely reflecting nephrolithiasis. 1.7 x 1.7 x 1.1 cm
hypoechoic right renal mass with thin internal septation concerning
for a mildly complicated cyst. Echogenicity within normal limits. No
hydronephrosis visualized.

Left Kidney: Length: 10.6 cm. 4 mm echogenic focus in the midpole
likely reflecting nephrolithiasis. Echogenicity within normal
limits. No mass or hydronephrosis visualized.

Abdominal aorta: No aneurysm visualized.

Other findings: None.
IMPRESSION: 1. No cholelithiasis or sonographic evidence of acute cholecystitis.
2. Bilateral nephrolithiasis.
3. 1.7 x 1.7 x 1.1 cm hypoechoic right renal mass with thin internal
septation concerning for a mildly complicated cyst. Further
evaluation with nonemergent CT or MRI of the abdomen is recommended
for better characterization.

## 2017-01-21 ENCOUNTER — Other Ambulatory Visit: Payer: Self-pay

## 2017-01-21 ENCOUNTER — Ambulatory Visit: Payer: Self-pay | Admitting: Hematology & Oncology

## 2017-01-21 ENCOUNTER — Other Ambulatory Visit: Payer: Self-pay | Admitting: Hematology & Oncology

## 2017-03-04 ENCOUNTER — Ambulatory Visit (HOSPITAL_BASED_OUTPATIENT_CLINIC_OR_DEPARTMENT_OTHER): Payer: Self-pay | Admitting: Family

## 2017-03-04 ENCOUNTER — Other Ambulatory Visit (HOSPITAL_BASED_OUTPATIENT_CLINIC_OR_DEPARTMENT_OTHER): Payer: Self-pay

## 2017-03-04 VITALS — BP 156/80 | HR 101 | Temp 97.9°F | Resp 18 | Wt 119.0 lb

## 2017-03-04 DIAGNOSIS — D509 Iron deficiency anemia, unspecified: Secondary | ICD-10-CM | POA: Insufficient documentation

## 2017-03-04 DIAGNOSIS — D45 Polycythemia vera: Secondary | ICD-10-CM

## 2017-03-04 DIAGNOSIS — D500 Iron deficiency anemia secondary to blood loss (chronic): Secondary | ICD-10-CM

## 2017-03-04 LAB — CBC WITH DIFFERENTIAL (CANCER CENTER ONLY)
BASO#: 0 10*3/uL (ref 0.0–0.2)
BASO%: 0.7 % (ref 0.0–2.0)
EOS ABS: 0.1 10*3/uL (ref 0.0–0.5)
EOS%: 1 % (ref 0.0–7.0)
HEMATOCRIT: 38 % (ref 34.8–46.6)
HGB: 13.6 g/dL (ref 11.6–15.9)
LYMPH#: 2.2 10*3/uL (ref 0.9–3.3)
LYMPH%: 35.1 % (ref 14.0–48.0)
MCH: 46.1 pg — AB (ref 26.0–34.0)
MCV: 126 fL — ABNORMAL HIGH (ref 81–101)
MONO#: 0.9 10*3/uL (ref 0.1–0.9)
MONO%: 14.1 % — AB (ref 0.0–13.0)
NEUT#: 3 10*3/uL (ref 1.5–6.5)
NEUT%: 49.1 % (ref 39.6–80.0)
Platelets: 269 10*3/uL (ref 145–400)
RBC: 2.95 10*6/uL — ABNORMAL LOW (ref 3.70–5.32)
RDW: 12.4 % (ref 11.1–15.7)
WBC: 6.2 10*3/uL (ref 3.9–10.0)

## 2017-03-04 LAB — CMP (CANCER CENTER ONLY)
ALK PHOS: 86 U/L — AB (ref 26–84)
ALT(SGPT): 20 U/L (ref 10–47)
AST: 25 U/L (ref 11–38)
Albumin: 3.7 g/dL (ref 3.3–5.5)
BILIRUBIN TOTAL: 0.5 mg/dL (ref 0.20–1.60)
BUN, Bld: 14 mg/dL (ref 7–22)
CALCIUM: 9.4 mg/dL (ref 8.0–10.3)
CHLORIDE: 102 meq/L (ref 98–108)
CO2: 29 meq/L (ref 18–33)
Creat: 0.9 mg/dl (ref 0.6–1.2)
GLUCOSE: 121 mg/dL — AB (ref 73–118)
Potassium: 4.5 mEq/L (ref 3.3–4.7)
Sodium: 143 mEq/L (ref 128–145)
Total Protein: 7.6 g/dL (ref 6.4–8.1)

## 2017-03-04 LAB — IRON AND TIBC
%SAT: 27 % (ref 21–57)
Iron: 85 ug/dL (ref 41–142)
TIBC: 309 ug/dL (ref 236–444)
UIBC: 224 ug/dL (ref 120–384)

## 2017-03-04 LAB — FERRITIN: Ferritin: 92 ng/ml (ref 9–269)

## 2017-03-04 LAB — CHCC SATELLITE - SMEAR

## 2017-03-04 LAB — LACTATE DEHYDROGENASE: LDH: 208 U/L (ref 125–245)

## 2017-03-04 NOTE — Progress Notes (Signed)
Hematology and Oncology Follow Up Visit  JOYA WILLMOTT [id number] Jul 01, 1955 61 y.o. 03/04/2017   Principle Diagnosis:  Polycythemia vera-JAK2 positive  Current Therapy:   Hydrea  500mg  po q day, alternating with 1000 mg daily Aspirin 81 mg by mouth daily Phlebotomy to maintain hematocrit below 45%   Interim History:  Ms. Keltner is here today for follow-up. She is doing well but occasionally has some fatigue and a twinge of pain in the left upper quadrant.  She states that she is mostly taking 1,000 mg of Hydrea daily and occassionally 500 mg PO daily. Her platelet count is stable at 269 and Hct 38.0. No phlebotomy needed this visit which makes her happy. She really does not like to have them done.  No anemia. No episodes of bleeding, bruising or petechiae.  No lymphadenopathy found on exam.  No fever, chills, n/v, cough, rash, dizziness, headache, vision changes, SOB, chest pain, palpitations or changes in bowel or bladder habits.  No swelling, tenderness,  She has maintained a good appetite and is staying well hydrated. Her weight is stable.   ECOG Performance Status: 1 - Symptomatic but completely ambulatory  Medications:  Allergies as of 03/04/2017   No Known Allergies     Medication List       Accurate as of 03/04/17  8:31 AM. Always use your most recent med list.          hydroxyurea 500 MG capsule Commonly known as:  HYDREA TAKE 1 CAPSULE BY MOUTH TWICE DAILY WITH FOOD TO MINIMIZE GI SIDE EFFECTS       Allergies: No Known Allergies  Past Medical History, Surgical history, Social history, and Family History were reviewed and updated.  Review of Systems: All other 10 point review of systems is negative.   Physical Exam:  vitals were not taken for this visit.  Wt Readings from Last 3 Encounters:  09/24/16 116 lb (52.6 kg)  06/25/16 116 lb (52.6 kg)  03/01/16 117 lb (53.1 kg)    Ocular: Sclerae unicteric, pupils equal, round and reactive to  light Ear-nose-throat: Oropharynx clear, dentition fair Lymphatic: No cervical, supraclavicular or axillary adenopathy Lungs no rales or rhonchi, good excursion bilaterally Heart regular rate and rhythm, no murmur appreciated Abd soft, nontender, positive bowel sounds, no liver or spleen tip palpated one exam, no fluid wave  MSK no focal spinal tenderness, no joint edema Neuro: non-focal, well-oriented, appropriate affect Breasts: Deferred   Lab Results  Component Value Date   WBC 9.9 09/24/2016   HGB 15.2 09/24/2016   HCT 42.3 09/24/2016   MCV 113 (H) 09/24/2016   PLT 414 Platelet count consistent in citrate (H) 09/24/2016   Lab Results  Component Value Date   FERRITIN 61 09/24/2016   IRON 140 09/24/2016   TIBC 303 09/24/2016   UIBC 163 09/24/2016   IRONPCTSAT 46 09/24/2016   Lab Results  Component Value Date   RBC 3.75 09/24/2016   No results found for: KPAFRELGTCHN, LAMBDASER, KAPLAMBRATIO No results found for: IGGSERUM, IGA, IGMSERUM No results found for: Ronnald Ramp, A1GS, A2GS, Tillman Sers, SPEI   Chemistry      Component Value Date/Time   NA 137 09/24/2016 0950   NA 138 06/25/2016 1159   K 4.0 09/24/2016 0950   K 3.9 06/25/2016 1159   CL 105 09/24/2016 0950   CO2 26 09/24/2016 0950   CO2 23 06/25/2016 1159   BUN 9 09/24/2016 0950   BUN 10.3 06/25/2016 1159  CREATININE 0.6 09/24/2016 0950   CREATININE 0.9 06/25/2016 1159      Component Value Date/Time   CALCIUM 9.6 09/24/2016 0950   CALCIUM 9.7 06/25/2016 1159   ALKPHOS 97 (H) 09/24/2016 0950   ALKPHOS 96 06/25/2016 1159   AST 24 09/24/2016 0950   AST 21 06/25/2016 1159   ALT 14 09/24/2016 0950   ALT 16 06/25/2016 1159   BILITOT 0.70 09/24/2016 0950   BILITOT 0.44 06/25/2016 1159      Impression and Plan: Ms. Aubert is a very pleasant 61 yo caucasian female with polycythemia vera, JAK2 positive. She is doing well and only has occasional fatigue at times.  Hct  today is 38.0 and platelets count is stable at 269. No phlebotomy needed this visit.  She verbalized that she is taking her Hydrea daily as well as 1 baby aspirin daily.  She will continue in her same regimen and we will plan to see her back again in another 4 months for repeat lab work and follow-up.  She is in agreement with the plan and will contact our office with any questions or concerns. We can certainly see her sooner if need be.   Eliezer Bottom, NP 10/22/20188:31 AM

## 2017-06-24 ENCOUNTER — Inpatient Hospital Stay: Payer: Self-pay

## 2017-06-24 ENCOUNTER — Inpatient Hospital Stay: Payer: Self-pay | Attending: Hematology & Oncology | Admitting: Hematology & Oncology

## 2017-06-24 ENCOUNTER — Other Ambulatory Visit: Payer: Self-pay

## 2017-06-24 VITALS — BP 143/75 | HR 97 | Temp 98.3°F | Resp 16 | Wt 117.8 lb

## 2017-06-24 DIAGNOSIS — D500 Iron deficiency anemia secondary to blood loss (chronic): Secondary | ICD-10-CM

## 2017-06-24 DIAGNOSIS — Z7982 Long term (current) use of aspirin: Secondary | ICD-10-CM | POA: Insufficient documentation

## 2017-06-24 DIAGNOSIS — D45 Polycythemia vera: Secondary | ICD-10-CM

## 2017-06-24 LAB — CBC WITH DIFFERENTIAL (CANCER CENTER ONLY)
BASOS PCT: 0 %
Basophils Absolute: 0 10*3/uL (ref 0.0–0.1)
EOS ABS: 0 10*3/uL (ref 0.0–0.5)
EOS PCT: 1 %
HCT: 37.7 % (ref 34.8–46.6)
HEMOGLOBIN: 13.7 g/dL (ref 11.6–15.9)
LYMPHS ABS: 1.7 10*3/uL (ref 0.9–3.3)
Lymphocytes Relative: 33 %
MCH: 45.9 pg — AB (ref 26.0–34.0)
MCHC: 36.3 g/dL — AB (ref 32.0–36.0)
MCV: 124.4 fL — ABNORMAL HIGH (ref 81.0–101.0)
MONOS PCT: 13 %
Monocytes Absolute: 0.7 10*3/uL (ref 0.1–0.9)
NEUTROS PCT: 53 %
Neutro Abs: 2.7 10*3/uL (ref 1.5–6.5)
PLATELETS: 178 10*3/uL (ref 145–400)
RBC: 3.03 MIL/uL — ABNORMAL LOW (ref 3.70–5.32)
RDW: 12.5 % (ref 11.1–15.7)
WBC: 5.1 10*3/uL (ref 3.9–10.0)

## 2017-06-24 LAB — CMP (CANCER CENTER ONLY)
ALBUMIN: 4.3 g/dL (ref 3.5–5.0)
ALK PHOS: 98 U/L (ref 40–150)
ALT: 12 U/L (ref 0–55)
ANION GAP: 10 (ref 3–11)
AST: 19 U/L (ref 5–34)
BUN: 13 mg/dL (ref 7–26)
CO2: 25 mmol/L (ref 22–29)
Calcium: 9.7 mg/dL (ref 8.4–10.4)
Chloride: 103 mmol/L (ref 98–109)
Creatinine: 0.92 mg/dL (ref 0.60–1.10)
GFR, Estimated: >60 mL/min (ref >60)
GLUCOSE: 106 mg/dL (ref 70–140)
POTASSIUM: 4.9 mmol/L (ref 3.5–5.1)
SODIUM: 138 mmol/L (ref 136–145)
Total Bilirubin: 0.4 mg/dL (ref 0.2–1.2)
Total Protein: 7.7 g/dL (ref 6.4–8.3)

## 2017-06-24 LAB — IRON AND TIBC
IRON: 88 ug/dL (ref 41–142)
SATURATION RATIOS: 30 % (ref 21–57)
TIBC: 293 ug/dL (ref 236–444)
UIBC: 204 ug/dL

## 2017-06-24 LAB — SAVE SMEAR

## 2017-06-24 LAB — LACTATE DEHYDROGENASE: LDH: 221 U/L (ref 125–245)

## 2017-06-24 LAB — FERRITIN: FERRITIN: 91 ng/mL (ref 9–269)

## 2017-06-24 NOTE — Progress Notes (Signed)
Hematology and Oncology Follow Up Visit  Cassandra Moses [id number] [birth year] 62 y.o. 06/24/2017   Principle Diagnosis:  Polycythemia vera-JAK2 positive  Current Therapy:   Hydrea  500mg  po q day - changed on 06/24/2017 Aspirin 81 mg by mouth daily Phlebotomy to maintain hematocrit below 45%   Interim History:  Cassandra Moses is here today for follow-up.  She says that she has occasional pain in the left upper quadrant of her abdomen.  I suppose it might be her spleen.  She is never had splenomegaly.  We can increase her Hydrea dose back in the fall.  She is done okay on this.  She has had no nausea or vomiting.  She has had no fever.  She is had no bleeding.  There is been no diarrhea.  She did have a nice holiday season.  Overall, her performance status is ECOG 0.   Medications:  Allergies as of 06/24/2017   No Known Allergies     Medication List        Accurate as of 06/24/17  8:14 AM. Always use your most recent med list.          hydroxyurea 500 MG capsule Commonly known as:  HYDREA TAKE 1 CAPSULE BY MOUTH TWICE DAILY WITH FOOD TO MINIMIZE GI SIDE EFFECTS       Allergies: No Known Allergies  Past Medical History, Surgical history, Social history, and Family History were reviewed and updated.  Review of Systems: Review of Systems  Constitutional: Negative.   HENT: Negative.   Eyes: Negative.   Respiratory: Negative.   Cardiovascular: Negative.   Gastrointestinal: Positive for abdominal pain.  Genitourinary: Negative.   Musculoskeletal: Negative.   Skin: Negative.   Neurological: Negative.   Endo/Heme/Allergies: Negative.   Psychiatric/Behavioral: Negative.      Physical Exam:  vitals were not taken for this visit.   Wt Readings from Last 3 Encounters:  03/04/17 119 lb (54 kg)  09/24/16 116 lb (52.6 kg)  06/25/16 116 lb (52.6 kg)    Physical Exam  Constitutional: She is oriented to person, place, and time.  HENT:  Head: Normocephalic  and atraumatic.  Mouth/Throat: Oropharynx is clear and moist.  Eyes: EOM are normal. Pupils are equal, round, and reactive to light.  Neck: Normal range of motion.  Cardiovascular: Normal rate, regular rhythm and normal heart sounds.  Pulmonary/Chest: Effort normal and breath sounds normal.  Abdominal: Soft. Bowel sounds are normal.  Musculoskeletal: Normal range of motion. She exhibits no edema, tenderness or deformity.  Lymphadenopathy:    She has no cervical adenopathy.  Neurological: She is alert and oriented to person, place, and time.  Skin: Skin is warm and dry. No rash noted. No erythema.  Psychiatric: She has a normal mood and affect. Her behavior is normal. Judgment and thought content normal.  Vitals reviewed.   Lab Results  Component Value Date   WBC 6.2 03/04/2017   HGB 13.6 03/04/2017   HCT 38.0 03/04/2017   MCV 126 (H) 03/04/2017   PLT 269 03/04/2017   Lab Results  Component Value Date   FERRITIN 92 03/04/2017   IRON 85 03/04/2017   TIBC 309 03/04/2017   UIBC 224 03/04/2017   IRONPCTSAT 27 03/04/2017   Lab Results  Component Value Date   RBC 2.95 (L) 03/04/2017   No results found for: KPAFRELGTCHN, LAMBDASER, KAPLAMBRATIO No results found for: IGGSERUM, IGA, IGMSERUM No results found for: TOTALPROTELP, ALBUMINELP, A1GS, A2GS, BETS, BETA2SER, GAMS, MSPIKE, SPEI  Chemistry      Component Value Date/Time   NA 143 03/04/2017 0808   NA 138 06/25/2016 1159   K 4.5 03/04/2017 0808   K 3.9 06/25/2016 1159   CL 102 03/04/2017 0808   CO2 29 03/04/2017 0808   CO2 23 06/25/2016 1159   BUN 14 03/04/2017 0808   BUN 10.3 06/25/2016 1159   CREATININE 0.9 03/04/2017 0808   CREATININE 0.9 06/25/2016 1159      Component Value Date/Time   CALCIUM 9.4 03/04/2017 0808   CALCIUM 9.7 06/25/2016 1159   ALKPHOS 86 (H) 03/04/2017 0808   ALKPHOS 96 06/25/2016 1159   AST 25 03/04/2017 0808   AST 21 06/25/2016 1159   ALT 20 03/04/2017 0808   ALT 16 06/25/2016 1159    BILITOT 0.50 03/04/2017 0808   BILITOT 0.44 06/25/2016 1159      Impression and Plan: Cassandra Moses is a very pleasant 62 yo caucasian female with polycythemia vera, JAK2 positive.   I will go ahead and change her Hydrea dose back to 500 mg daily.  Given that her platelet count has dropped, I really do not want to see it go lower.  I am not sure if this discomfort in the left upper quadrant is from her spleen.  Again, she has no splenomegaly.  I cannot feel her spleen on exam.  I want to see her back in 3 months.  I think 3 months is reasonable since we have to change her Hydrea dose.  If she is still complaining of pain, then we will get a ultrasound when we see her back.  Cassandra Napoleon, MD 2/11/20198:14 AM

## 2017-06-25 ENCOUNTER — Telehealth: Payer: Self-pay | Admitting: *Deleted

## 2017-06-25 NOTE — Telephone Encounter (Addendum)
Patient is aware of results  ----- Message from Eliezer Bottom, NP sent at 06/25/2017  9:15 AM EST ----- Hct 37.7, no phlebotomy needed.    ----- Message ----- From: Buel Ream, [facility] [place] Sent: 06/24/2017   8:15 AM To: Eliezer Bottom, NP

## 2017-08-11 ENCOUNTER — Other Ambulatory Visit: Payer: Self-pay | Admitting: Hematology & Oncology

## 2017-09-20 ENCOUNTER — Other Ambulatory Visit: Payer: Self-pay

## 2017-09-20 ENCOUNTER — Encounter: Payer: Self-pay | Admitting: Hematology & Oncology

## 2017-09-20 ENCOUNTER — Inpatient Hospital Stay: Payer: Self-pay

## 2017-09-20 ENCOUNTER — Inpatient Hospital Stay: Payer: Self-pay | Attending: Hematology & Oncology | Admitting: Hematology & Oncology

## 2017-09-20 VITALS — BP 136/78 | HR 106 | Temp 98.1°F | Resp 18 | Wt 116.0 lb

## 2017-09-20 DIAGNOSIS — Z7982 Long term (current) use of aspirin: Secondary | ICD-10-CM

## 2017-09-20 DIAGNOSIS — D45 Polycythemia vera: Secondary | ICD-10-CM

## 2017-09-20 LAB — CMP (CANCER CENTER ONLY)
ALBUMIN: 4.4 g/dL (ref 3.5–5.0)
ALT: 16 U/L (ref 0–55)
AST: 21 U/L (ref 5–34)
Alkaline Phosphatase: 94 U/L (ref 40–150)
Anion gap: 10 (ref 3–11)
BUN: 10 mg/dL (ref 7–26)
CHLORIDE: 102 mmol/L (ref 98–109)
CO2: 26 mmol/L (ref 22–29)
Calcium: 10.1 mg/dL (ref 8.4–10.4)
Creatinine: 0.87 mg/dL (ref 0.60–1.10)
GFR, Est AFR Am: >60 mL/min (ref >60)
GFR, Estimated: >60 mL/min (ref >60)
GLUCOSE: 111 mg/dL (ref 70–140)
Potassium: 4.5 mmol/L (ref 3.5–5.1)
SODIUM: 138 mmol/L (ref 136–145)
Total Bilirubin: 0.4 mg/dL (ref 0.2–1.2)
Total Protein: 7.9 g/dL (ref 6.4–8.3)

## 2017-09-20 LAB — CBC WITH DIFFERENTIAL (CANCER CENTER ONLY)
BASOS ABS: 0.1 10*3/uL (ref 0.0–0.1)
BASOS PCT: 1 %
EOS PCT: 1 %
Eosinophils Absolute: 0.1 10*3/uL (ref 0.0–0.5)
HCT: 43.3 % (ref 34.8–46.6)
Hemoglobin: 15.4 g/dL (ref 11.6–15.9)
Lymphocytes Relative: 22 %
Lymphs Abs: 2.1 10*3/uL (ref 0.9–3.3)
MCH: 43.6 pg — ABNORMAL HIGH (ref 26.0–34.0)
MCHC: 35.6 g/dL (ref 32.0–36.0)
MCV: 122.7 fL — ABNORMAL HIGH (ref 81.0–101.0)
MONO ABS: 1.1 10*3/uL — AB (ref 0.1–0.9)
Monocytes Relative: 11 %
Neutro Abs: 6.4 10*3/uL (ref 1.5–6.5)
Neutrophils Relative %: 65 %
PLATELETS: 341 10*3/uL (ref 145–400)
RBC: 3.53 MIL/uL — AB (ref 3.70–5.32)
RDW: 10.3 % — AB (ref 11.1–15.7)
WBC: 9.7 10*3/uL (ref 3.9–10.0)

## 2017-09-20 LAB — IRON AND TIBC
Iron: 134 ug/dL (ref 41–142)
SATURATION RATIOS: 40 % (ref 21–57)
TIBC: 331 ug/dL (ref 236–444)
UIBC: 197 ug/dL

## 2017-09-20 LAB — FERRITIN: Ferritin: 42 ng/mL (ref 9–269)

## 2017-09-20 LAB — LACTATE DEHYDROGENASE: LDH: 226 U/L (ref 125–245)

## 2017-09-20 NOTE — Progress Notes (Signed)
Hematology and Oncology Follow Up Visit  PATTY LEITZKE [id number] [birth year] 62 y.o. 09/20/2017   Principle Diagnosis:  Polycythemia vera-JAK2 positive  Current Therapy:   Hydrea  500mg  po q day - changed on 06/24/2017 Aspirin 81 mg by mouth daily Phlebotomy to maintain hematocrit below 45%   Interim History:  Ms. Huge is here today for follow-up.  She is a little bit sad today.  Her mother passed away recently.  They had a very close relationship.  She now is trying to help take care of her father.  She was very busy during tax season.  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  She was busy.  We did make a change with her Hydrea.  She is on 500 mg a day right now.  She is doing well with this.  She is had no pain in her hands or feet.  She is had no burning in her hands or feet.  She has had no nausea or vomiting.  She has had no headache.  We last saw her in February, her ferritin was 91 with an iron saturation of 30%.  There is no change in bowel or bladder habits.  She is had no rashes.  She is had no leg swelling.  Overall, her performance status is ECOG 0.   Medications:  Allergies as of 09/20/2017   No Known Allergies     Medication List        Accurate as of 09/20/17  9:59 AM. Always use your most recent med list.          aspirin 81 MG chewable tablet Chew 81 mg by mouth daily.   hydroxyurea 500 MG capsule Commonly known as:  HYDREA TAKE 1 CAPSULE BY MOUTH TWICE DAILY WITH FOOD TO MINIMIZE SIDE EFFECTS       Allergies: No Known Allergies  Past Medical History, Surgical history, Social history, and Family History were reviewed and updated.  Review of Systems: Review of Systems  Constitutional: Negative.   HENT: Negative.   Eyes: Negative.   Respiratory: Negative.   Cardiovascular: Negative.   Genitourinary: Negative.   Musculoskeletal: Negative.   Skin: Negative.   Neurological: Negative.   Endo/Heme/Allergies: Negative.     Psychiatric/Behavioral: Negative.      Physical Exam:  weight is 116 lb (52.6 kg). Her oral temperature is 98.1 F (36.7 C). Her blood pressure is 136/78 and her pulse is 106 (abnormal). Her respiration is 18 and oxygen saturation is 100%.   Wt Readings from Last 3 Encounters:  09/20/17 116 lb (52.6 kg)  06/24/17 117 lb 12.8 oz (53.4 kg)  03/04/17 119 lb (54 kg)    Physical Exam  Constitutional: She is oriented to person, place, and time.  HENT:  Head: Normocephalic and atraumatic.  Mouth/Throat: Oropharynx is clear and moist.  Eyes: Pupils are equal, round, and reactive to light. EOM are normal.  Neck: Normal range of motion.  Cardiovascular: Normal rate, regular rhythm and normal heart sounds.  Pulmonary/Chest: Effort normal and breath sounds normal.  Abdominal: Soft. Bowel sounds are normal.  Musculoskeletal: Normal range of motion. She exhibits no edema, tenderness or deformity.  Lymphadenopathy:    She has no cervical adenopathy.  Neurological: She is alert and oriented to person, place, and time.  Skin: Skin is warm and dry. No rash noted. No erythema.  Psychiatric: She has a normal mood and affect. Her behavior is normal. Judgment and thought content normal.  Vitals reviewed.  Lab Results  Component Value Date   WBC 9.7 09/20/2017   HGB 15.4 09/20/2017   HCT 43.3 09/20/2017   MCV 122.7 (H) 09/20/2017   PLT 341 09/20/2017   Lab Results  Component Value Date   FERRITIN 91 06/24/2017   IRON 88 06/24/2017   TIBC 293 06/24/2017   UIBC 204 06/24/2017   IRONPCTSAT 30 06/24/2017   Lab Results  Component Value Date   RBC 3.53 (L) 09/20/2017   No results found for: KPAFRELGTCHN, LAMBDASER, KAPLAMBRATIO No results found for: IGGSERUM, IGA, IGMSERUM No results found for: Odetta Pink, SPEI   Chemistry      Component Value Date/Time   NA 138 06/24/2017 0746   NA 143 03/04/2017 0808   NA 138 06/25/2016 1159    K 4.9 06/24/2017 0746   K 4.5 03/04/2017 0808   K 3.9 06/25/2016 1159   CL 103 06/24/2017 0746   CL 102 03/04/2017 0808   CO2 25 06/24/2017 0746   CO2 29 03/04/2017 0808   CO2 23 06/25/2016 1159   BUN 13 06/24/2017 0746   BUN 14 03/04/2017 0808   BUN 10.3 06/25/2016 1159   CREATININE 0.92 06/24/2017 0746   CREATININE 0.9 03/04/2017 0808   CREATININE 0.9 06/25/2016 1159      Component Value Date/Time   CALCIUM 9.7 06/24/2017 0746   CALCIUM 9.4 03/04/2017 0808   CALCIUM 9.7 06/25/2016 1159   ALKPHOS 98 06/24/2017 0746   ALKPHOS 86 (H) 03/04/2017 0808   ALKPHOS 96 06/25/2016 1159   AST 19 06/24/2017 0746   AST 21 06/25/2016 1159   ALT 12 06/24/2017 0746   ALT 20 03/04/2017 0808   ALT 16 06/25/2016 1159   BILITOT 0.4 06/24/2017 0746   BILITOT 0.44 06/25/2016 1159      Impression and Plan: Ms. Siedlecki is a very pleasant 62 yo caucasian female with polycythemia vera, JAK2 positive.    am not going to change her Hydrea dose.  We will keep her at 500 mg daily.  I will plan to see her back in 3 months.  She would prefer 6 months.  I told her that we really need to watch her platelet count.  We also need to make sure that her hemoglobin does not get too high.   [name], MD 5/10/20199:59 AM

## 2017-12-20 ENCOUNTER — Other Ambulatory Visit: Payer: Self-pay

## 2017-12-20 ENCOUNTER — Ambulatory Visit: Payer: Self-pay | Admitting: Hematology & Oncology

## 2018-02-19 ENCOUNTER — Encounter: Payer: Self-pay | Admitting: Family

## 2018-02-19 ENCOUNTER — Inpatient Hospital Stay: Payer: BLUE CROSS/BLUE SHIELD | Attending: Hematology & Oncology

## 2018-02-19 ENCOUNTER — Other Ambulatory Visit: Payer: Self-pay

## 2018-02-19 ENCOUNTER — Inpatient Hospital Stay (HOSPITAL_BASED_OUTPATIENT_CLINIC_OR_DEPARTMENT_OTHER): Payer: BLUE CROSS/BLUE SHIELD | Admitting: Family

## 2018-02-19 VITALS — BP 143/91 | HR 98 | Temp 98.2°F | Resp 20 | Wt 110.0 lb

## 2018-02-19 DIAGNOSIS — D45 Polycythemia vera: Secondary | ICD-10-CM | POA: Insufficient documentation

## 2018-02-19 DIAGNOSIS — Z7982 Long term (current) use of aspirin: Secondary | ICD-10-CM | POA: Diagnosis not present

## 2018-02-19 DIAGNOSIS — D500 Iron deficiency anemia secondary to blood loss (chronic): Secondary | ICD-10-CM

## 2018-02-19 LAB — CBC WITH DIFFERENTIAL (CANCER CENTER ONLY)
Abs Immature Granulocytes: 0.04 10*3/uL (ref 0.00–0.07)
BASOS ABS: 0.1 10*3/uL (ref 0.0–0.1)
BASOS PCT: 0 %
EOS ABS: 0.2 10*3/uL (ref 0.0–0.5)
EOS PCT: 1 %
HCT: 46.3 % — ABNORMAL HIGH (ref 36.0–46.0)
Hemoglobin: 16.2 g/dL — ABNORMAL HIGH (ref 12.0–15.0)
Immature Granulocytes: 0 %
Lymphocytes Relative: 29 %
Lymphs Abs: 3.3 10*3/uL (ref 0.7–4.0)
MCH: 40.8 pg — ABNORMAL HIGH (ref 26.0–34.0)
MCHC: 35 g/dL (ref 30.0–36.0)
MCV: 116.6 fL — ABNORMAL HIGH (ref 80.0–100.0)
Monocytes Absolute: 1.2 10*3/uL — ABNORMAL HIGH (ref 0.1–1.0)
Monocytes Relative: 10 %
NRBC: 0 % (ref 0.0–0.2)
Neutro Abs: 6.7 10*3/uL (ref 1.7–7.7)
Neutrophils Relative %: 60 %
PLATELETS: 436 10*3/uL — AB (ref 150–400)
RBC: 3.97 MIL/uL (ref 3.87–5.11)
RDW: 12.2 % (ref 11.5–15.5)
WBC: 11.5 10*3/uL — AB (ref 4.0–10.5)

## 2018-02-19 LAB — CMP (CANCER CENTER ONLY)
ALT: 26 U/L (ref 10–47)
ANION GAP: 7 (ref 5–15)
AST: 25 U/L (ref 11–38)
Albumin: 3.9 g/dL (ref 3.5–5.0)
Alkaline Phosphatase: 102 U/L — ABNORMAL HIGH (ref 26–84)
BILIRUBIN TOTAL: 0.5 mg/dL (ref 0.2–1.6)
BUN: 8 mg/dL (ref 7–22)
CHLORIDE: 106 mmol/L (ref 98–108)
CO2: 30 mmol/L (ref 18–33)
CREATININE: 1 mg/dL (ref 0.60–1.20)
Calcium: 9.5 mg/dL (ref 8.0–10.3)
Glucose, Bld: 98 mg/dL (ref 73–118)
Potassium: 4.5 mmol/L (ref 3.3–4.7)
Sodium: 143 mmol/L (ref 128–145)
Total Protein: 7.6 g/dL (ref 6.4–8.1)

## 2018-02-19 LAB — SAVE SMEAR(SSMR), FOR PROVIDER SLIDE REVIEW

## 2018-02-19 NOTE — Progress Notes (Signed)
Hematology and Oncology Follow Up Visit  Cassandra Moses [id number] 1956-02-05 62 y.o. 02/19/2018   Principle Diagnosis:  Polycythemia vera - JAK2 positive  Current Therapy:   Hydrea500mg  po q day  Aspirin 81 mg by mouth daily Phlebotomy to maintain hematocrit below 45%   Interim History: Cassandra Moses is here today for follow-up. Hct is 46.3%. She refused phlebotomy today and we will have her take Hydrea 500 mg PO BID every other day, alternating with 500 mg PO.  She is taking her baby aspirin daily as well.  WBC count is 11.5 and platelet count 436.  She has had some intermittent positional left sided discomfort at night. We discussed doing an [place] to better evaluate since her last abdominal scan was in 2017 but she wants to hold off for now. She will let us know if it becomes more frequent and we can set this up for her.  No fever, chills, n/v, cough, rash, dizziness, SOB, chest pain, palpitations or changes in bowel or bladder habits.  No swelling, tenderness, numbness or tingling in her extremities. No c/o pain at this time.  No lymphadenopathy noted on exam.  No episode of bleeding, no bruising or petechiae.  She has a good appetite and is staying well hydrated. Her weight is stable.   ECOG Performance Status: 1 - Symptomatic but completely ambulatory  Medications:  Allergies as of 02/19/2018   No Known Allergies     Medication List        Accurate as of 02/19/18  3:26 PM. Always use your most recent med list.          aspirin 81 MG chewable tablet Chew 81 mg by mouth daily.   hydroxyurea 500 MG capsule Commonly known as:  HYDREA TAKE 1 CAPSULE BY MOUTH TWICE DAILY WITH FOOD TO MINIMIZE SIDE EFFECTS       Allergies: No Known Allergies  Past Medical History, Surgical history, Social history, and Family History were reviewed and updated.  Review of Systems: All other 10 point review of systems is negative.   Physical Exam:  weight is 110 lb (49.9 kg).  Her oral temperature is 98.2 F (36.8 C). Her blood pressure is 143/91 (abnormal) and her pulse is 98. Her respiration is 20 and oxygen saturation is 100%.   Wt Readings from Last 3 Encounters:  02/19/18 110 lb (49.9 kg)  09/20/17 116 lb (52.6 kg)  06/24/17 117 lb 12.8 oz (53.4 kg)    Ocular: Sclerae unicteric, pupils equal, round and reactive to light Ear-nose-throat: Oropharynx clear, dentition fair Lymphatic: No cervical, supraclavicular or axillary adenopathy Lungs no rales or rhonchi, good excursion bilaterally Heart regular rate and rhythm, no murmur appreciated Abd soft, nontender, positive bowel sounds, no liver or spleen tip palpated on exam, no fluid wave  MSK no focal spinal tenderness, no joint edema Neuro: non-focal, well-oriented, appropriate affect Breasts: Deferred   Lab Results  Component Value Date   WBC 11.5 (H) 02/19/2018   HGB 16.2 (H) 02/19/2018   HCT 46.3 (H) 02/19/2018   MCV 116.6 (H) 02/19/2018   PLT 436 (H) 02/19/2018   Lab Results  Component Value Date   FERRITIN 42 09/20/2017   IRON 134 09/20/2017   TIBC 331 09/20/2017   UIBC 197 09/20/2017   IRONPCTSAT 40 09/20/2017   Lab Results  Component Value Date   RBC 3.97 02/19/2018   No results found for: KPAFRELGTCHN, LAMBDASER, KAPLAMBRATIO No results found for: IGGSERUM, IGA, IGMSERUM No results found for:  Cassandra Moses, SPEI   Chemistry      Component Value Date/Time   NA 138 09/20/2017 0902   NA 143 03/04/2017 0808   NA 138 06/25/2016 1159   K 4.5 09/20/2017 0902   K 4.5 03/04/2017 0808   K 3.9 06/25/2016 1159   CL 102 09/20/2017 0902   CL 102 03/04/2017 0808   CO2 26 09/20/2017 0902   CO2 29 03/04/2017 0808   CO2 23 06/25/2016 1159   BUN 10 09/20/2017 0902   BUN 14 03/04/2017 0808   BUN 10.3 06/25/2016 1159   CREATININE 0.87 09/20/2017 0902   CREATININE 0.9 03/04/2017 0808   CREATININE 0.9 06/25/2016 1159      Component Value  Date/Time   CALCIUM 10.1 09/20/2017 0902   CALCIUM 9.4 03/04/2017 0808   CALCIUM 9.7 06/25/2016 1159   ALKPHOS 94 09/20/2017 0902   ALKPHOS 86 (H) 03/04/2017 0808   ALKPHOS 96 06/25/2016 1159   AST 21 09/20/2017 0902   AST 21 06/25/2016 1159   ALT 16 09/20/2017 0902   ALT 20 03/04/2017 0808   ALT 16 06/25/2016 1159   BILITOT 0.4 09/20/2017 0902   BILITOT 0.44 06/25/2016 1159      Impression and Plan: Ms. Glauber is a very pleasant 62 yo caucasian female with polycythemia vera, JAK-2 positive. She would prefer to not have a phlebotomy at this time.  As such, we will have her alternate Hydrea 500 mg PO daily with 500 mg PO BID and repeat labs in 6 weeks.  She will contact our office if she changes her mind about the abdominal US.  We will plan to see her back in 3 months for MD visit.  She will contact us with any questions or concerns. We can certainly see her sooner if needed.   Laverna Peace, NP 10/9/20193:26 PM

## 2018-02-20 LAB — IRON AND TIBC
IRON: 66 ug/dL (ref 41–142)
SATURATION RATIOS: 20 % — AB (ref 21–57)
TIBC: 330 ug/dL (ref 236–444)
UIBC: 264 ug/dL

## 2018-02-20 LAB — LACTATE DEHYDROGENASE: LDH: 224 U/L — AB (ref 98–192)

## 2018-02-20 LAB — FERRITIN: Ferritin: 45 ng/mL (ref 11–307)

## 2018-03-14 ENCOUNTER — Other Ambulatory Visit: Payer: Self-pay

## 2018-03-14 ENCOUNTER — Ambulatory Visit: Payer: Self-pay | Admitting: Hematology & Oncology

## 2018-04-02 ENCOUNTER — Other Ambulatory Visit: Payer: BLUE CROSS/BLUE SHIELD

## 2018-05-01 ENCOUNTER — Other Ambulatory Visit: Payer: Self-pay | Admitting: Hematology & Oncology

## 2018-05-21 ENCOUNTER — Other Ambulatory Visit: Payer: Self-pay

## 2018-05-21 ENCOUNTER — Inpatient Hospital Stay: Payer: BLUE CROSS/BLUE SHIELD | Attending: Hematology & Oncology | Admitting: Hematology & Oncology

## 2018-05-21 ENCOUNTER — Inpatient Hospital Stay: Payer: BLUE CROSS/BLUE SHIELD

## 2018-05-21 ENCOUNTER — Encounter: Payer: Self-pay | Admitting: Hematology & Oncology

## 2018-05-21 VITALS — BP 156/83 | HR 110 | Temp 99.0°F | Resp 18 | Wt 112.2 lb

## 2018-05-21 DIAGNOSIS — D45 Polycythemia vera: Secondary | ICD-10-CM

## 2018-05-21 DIAGNOSIS — Z7982 Long term (current) use of aspirin: Secondary | ICD-10-CM

## 2018-05-21 DIAGNOSIS — D500 Iron deficiency anemia secondary to blood loss (chronic): Secondary | ICD-10-CM

## 2018-05-21 LAB — CBC WITH DIFFERENTIAL (CANCER CENTER ONLY)
Abs Immature Granulocytes: 0.03 10*3/uL (ref 0.00–0.07)
BASOS PCT: 0 %
Basophils Absolute: 0 10*3/uL (ref 0.0–0.1)
Eosinophils Absolute: 0 10*3/uL (ref 0.0–0.5)
Eosinophils Relative: 1 %
HCT: 41.2 % (ref 36.0–46.0)
Hemoglobin: 14.8 g/dL (ref 12.0–15.0)
Immature Granulocytes: 1 %
Lymphocytes Relative: 26 %
Lymphs Abs: 1.6 10*3/uL (ref 0.7–4.0)
MCH: 42.9 pg — ABNORMAL HIGH (ref 26.0–34.0)
MCHC: 35.9 g/dL (ref 30.0–36.0)
MCV: 119.4 fL — ABNORMAL HIGH (ref 80.0–100.0)
Monocytes Absolute: 0.5 10*3/uL (ref 0.1–1.0)
Monocytes Relative: 7 %
Neutro Abs: 4 10*3/uL (ref 1.7–7.7)
Neutrophils Relative %: 65 %
PLATELETS: 219 10*3/uL (ref 150–400)
RBC: 3.45 MIL/uL — AB (ref 3.87–5.11)
RDW: 14.3 % (ref 11.5–15.5)
WBC Count: 6.2 10*3/uL (ref 4.0–10.5)
nRBC: 0 % (ref 0.0–0.2)

## 2018-05-21 LAB — CMP (CANCER CENTER ONLY)
ALT: 11 U/L (ref 0–44)
AST: 16 U/L (ref 15–41)
Albumin: 4.3 g/dL (ref 3.5–5.0)
Alkaline Phosphatase: 82 U/L (ref 38–126)
Anion gap: 8 (ref 5–15)
BUN: 14 mg/dL (ref 6–20)
CO2: 28 mmol/L (ref 22–32)
Calcium: 9.2 mg/dL (ref 8.9–10.3)
Chloride: 101 mmol/L (ref 98–111)
Creatinine: 0.85 mg/dL (ref 0.44–1.00)
GFR, Est AFR Am: >60 mL/min (ref >60)
GFR, Estimated: >60 mL/min (ref >60)
Glucose, Bld: 115 mg/dL — ABNORMAL HIGH (ref 70–99)
Potassium: 4.5 mmol/L (ref 3.5–5.1)
Sodium: 137 mmol/L (ref 135–145)
TOTAL PROTEIN: 7.3 g/dL (ref 6.5–8.1)
Total Bilirubin: 0.4 mg/dL (ref 0.3–1.2)

## 2018-05-21 LAB — IRON AND TIBC
Iron: 111 ug/dL (ref 41–142)
SATURATION RATIOS: 35 % (ref 21–57)
TIBC: 313 ug/dL (ref 236–444)
UIBC: 202 ug/dL (ref 120–384)

## 2018-05-21 LAB — SAVE SMEAR(SSMR), FOR PROVIDER SLIDE REVIEW

## 2018-05-21 LAB — LACTATE DEHYDROGENASE: LDH: 178 U/L (ref 98–192)

## 2018-05-21 LAB — FERRITIN: Ferritin: 90 ng/mL (ref 11–307)

## 2018-05-21 NOTE — Progress Notes (Signed)
Hematology and Oncology Follow Up Visit  Cassandra Moses [id number] 21-Dec-1955 63 y.o. 05/21/2018   Principle Diagnosis:  Polycythemia vera-JAK2 positive  Current Therapy:   Hydrea  500mg  po q day - changed on 06/24/2017 Aspirin 81 mg by mouth daily Phlebotomy to maintain hematocrit below 45%   Interim History:  [name] is here today for follow-up.  Everything is going pretty well for her.  Of course, now that this is the new year, she is busy getting ready for tax season.  She works for [employer].  She had a nice Thanksgiving.  She had a nice Christmas.  She was actually down in [place] for part of the holidays.  She actually is taking 2 Hydrea at a time on occasion.  She was worried that the last time she was here, her hemoglobin was 16.2 and hematocrit 46.3.  She did not want to be phlebotomized.  Her iron studies when she was last here showed a ferritin of 45 with an iron saturation of 20%.  She has had no fever.  There is been no headache.  She has had no rashes.  She has had no leg swelling.  She is still trying to exercise.   Overall, her performance status is ECOG 0.   Medications:  Allergies as of 05/21/2018   No Known Allergies     Medication List       Accurate as of May 21, 2018  9:28 AM. Always use your most recent med list.        aspirin 81 MG chewable tablet Chew 81 mg by mouth daily.   hydroxyurea 500 MG capsule Commonly known as:  HYDREA TAKE 1 CAPSULE BY MOUTH TWICE DAILY WITH FOOD TO MINIMIZE SIDE EFFECTS       Allergies: No Known Allergies  Past Medical History, Surgical history, Social history, and Family History were reviewed and updated.  Review of Systems: Review of Systems  Constitutional: Negative.   HENT: Negative.   Eyes: Negative.   Respiratory: Negative.   Cardiovascular: Negative.   Genitourinary: Negative.   Musculoskeletal: Negative.   Skin: Negative.   Neurological: Negative.     Endo/Heme/Allergies: Negative.   Psychiatric/Behavioral: Negative.      Physical Exam:  weight is 112 lb 4 oz (50.9 kg). Her oral temperature is 99 F (37.2 C). Her blood pressure is 156/83 (abnormal) and her pulse is 110 (abnormal). Her respiration is 18 and oxygen saturation is 100%.   Wt Readings from Last 3 Encounters:  05/21/18 112 lb 4 oz (50.9 kg)  02/19/18 110 lb (49.9 kg)  09/20/17 116 lb (52.6 kg)    Physical Exam Vitals signs reviewed.  HENT:     Head: Normocephalic and atraumatic.  Eyes:     Pupils: Pupils are equal, round, and reactive to light.  Neck:     Musculoskeletal: Normal range of motion.  Cardiovascular:     Rate and Rhythm: Normal rate and regular rhythm.     Heart sounds: Normal heart sounds.  Pulmonary:     Effort: Pulmonary effort is normal.     Breath sounds: Normal breath sounds.  Abdominal:     General: Bowel sounds are normal.     Palpations: Abdomen is soft.  Musculoskeletal: Normal range of motion.        General: No tenderness or deformity.  Lymphadenopathy:     Cervical: No cervical adenopathy.  Skin:    General: Skin is warm and dry.  Findings: No erythema or rash.  Neurological:     Mental Status: She is alert and oriented to person, place, and time.  Psychiatric:        Behavior: Behavior normal.        Thought Content: Thought content normal.        Judgment: Judgment normal.     Lab Results  Component Value Date   WBC 6.2 05/21/2018   HGB 14.8 05/21/2018   HCT 41.2 05/21/2018   MCV 119.4 (H) 05/21/2018   PLT 219 05/21/2018   Lab Results  Component Value Date   FERRITIN 45 02/19/2018   IRON 66 02/19/2018   TIBC 330 02/19/2018   UIBC 264 02/19/2018   IRONPCTSAT 20 (L) 02/19/2018   Lab Results  Component Value Date   RBC 3.45 (L) 05/21/2018   No results found for: KPAFRELGTCHN, LAMBDASER, KAPLAMBRATIO No results found for: IGGSERUM, IGA, IGMSERUM No results found for: Odetta Pink, SPEI   Chemistry      Component Value Date/Time   NA 137 05/21/2018 0832   NA 143 03/04/2017 0808   NA 138 06/25/2016 1159   K 4.5 05/21/2018 0832   K 4.5 03/04/2017 0808   K 3.9 06/25/2016 1159   CL 101 05/21/2018 0832   CL 102 03/04/2017 0808   CO2 28 05/21/2018 0832   CO2 29 03/04/2017 0808   CO2 23 06/25/2016 1159   BUN 14 05/21/2018 0832   BUN 14 03/04/2017 0808   BUN 10.3 06/25/2016 1159   CREATININE 0.85 05/21/2018 0832   CREATININE 0.9 03/04/2017 0808   CREATININE 0.9 06/25/2016 1159      Component Value Date/Time   CALCIUM 9.2 05/21/2018 0832   CALCIUM 9.4 03/04/2017 0808   CALCIUM 9.7 06/25/2016 1159   ALKPHOS 82 05/21/2018 0832   ALKPHOS 86 (H) 03/04/2017 0808   ALKPHOS 96 06/25/2016 1159   AST 16 05/21/2018 0832   AST 21 06/25/2016 1159   ALT 11 05/21/2018 0832   ALT 20 03/04/2017 0808   ALT 16 06/25/2016 1159   BILITOT 0.4 05/21/2018 0832   BILITOT 0.44 06/25/2016 1159      Impression and Plan: [name] is a very pleasant 63 yo caucasian female caucasian female with polycythemia vera, JAK2 positive.   I am not going to change her Hydrea dose.  This is working for her.  Her blood counts are doing quite well.  She is able to manage taking extra Hydrea on occasion.  I will now get her back after the tax season is done.  I will get her back at the end of April.  I do not want to interfere with her as she will be very busy for the next 3 months.   [name], MD 1/8/20209:28 AM

## 2018-06-12 ENCOUNTER — Other Ambulatory Visit: Payer: Self-pay | Admitting: Hematology & Oncology

## 2018-07-25 ENCOUNTER — Other Ambulatory Visit: Payer: Self-pay | Admitting: Hematology & Oncology

## 2018-09-03 ENCOUNTER — Encounter: Payer: Self-pay | Admitting: Hematology & Oncology

## 2018-09-03 ENCOUNTER — Telehealth: Payer: Self-pay | Admitting: Hematology & Oncology

## 2018-09-03 ENCOUNTER — Inpatient Hospital Stay: Payer: Self-pay | Attending: Hematology & Oncology | Admitting: Hematology & Oncology

## 2018-09-03 ENCOUNTER — Inpatient Hospital Stay: Payer: Self-pay

## 2018-09-03 ENCOUNTER — Other Ambulatory Visit: Payer: Self-pay

## 2018-09-03 ENCOUNTER — Telehealth: Payer: Self-pay | Admitting: Family Medicine

## 2018-09-03 VITALS — BP 156/82 | HR 70 | Temp 98.1°F | Resp 19 | Wt 110.0 lb

## 2018-09-03 DIAGNOSIS — D45 Polycythemia vera: Secondary | ICD-10-CM | POA: Insufficient documentation

## 2018-09-03 DIAGNOSIS — R030 Elevated blood-pressure reading, without diagnosis of hypertension: Secondary | ICD-10-CM | POA: Insufficient documentation

## 2018-09-03 DIAGNOSIS — Z79899 Other long term (current) drug therapy: Secondary | ICD-10-CM | POA: Insufficient documentation

## 2018-09-03 LAB — CMP (CANCER CENTER ONLY)
ALT: 14 U/L (ref 0–44)
AST: 17 U/L (ref 15–41)
Albumin: 4.5 g/dL (ref 3.5–5.0)
Alkaline Phosphatase: 80 U/L (ref 38–126)
Anion gap: 9 (ref 5–15)
BUN: 15 mg/dL (ref 6–20)
CO2: 29 mmol/L (ref 22–32)
Calcium: 10 mg/dL (ref 8.9–10.3)
Chloride: 102 mmol/L (ref 98–111)
Creatinine: 0.77 mg/dL (ref 0.44–1.00)
GFR, Est AFR Am: >60 mL/min (ref >60)
GFR, Estimated: >60 mL/min (ref >60)
Glucose, Bld: 114 mg/dL — ABNORMAL HIGH (ref 70–99)
Potassium: 4.4 mmol/L (ref 3.5–5.1)
Sodium: 140 mmol/L (ref 135–145)
Total Bilirubin: 0.4 mg/dL (ref 0.3–1.2)
Total Protein: 7.1 g/dL (ref 6.5–8.1)

## 2018-09-03 LAB — CBC WITH DIFFERENTIAL (CANCER CENTER ONLY)
Abs Immature Granulocytes: 0.01 10*3/uL (ref 0.00–0.07)
Basophils Absolute: 0 10*3/uL (ref 0.0–0.1)
Basophils Relative: 1 %
Eosinophils Absolute: 0.1 10*3/uL (ref 0.0–0.5)
Eosinophils Relative: 1 %
HCT: 39.1 % (ref 36.0–46.0)
Hemoglobin: 14.1 g/dL (ref 12.0–15.0)
Immature Granulocytes: 0 %
Lymphocytes Relative: 37 %
Lymphs Abs: 1.9 10*3/uL (ref 0.7–4.0)
MCH: 46.5 pg — ABNORMAL HIGH (ref 26.0–34.0)
MCHC: 36.1 g/dL — ABNORMAL HIGH (ref 30.0–36.0)
MCV: 129 fL — ABNORMAL HIGH (ref 80.0–100.0)
Monocytes Absolute: 0.5 10*3/uL (ref 0.1–1.0)
Monocytes Relative: 10 %
Neutro Abs: 2.8 10*3/uL (ref 1.7–7.7)
Neutrophils Relative %: 51 %
Platelet Count: 201 10*3/uL (ref 150–400)
RBC: 3.03 MIL/uL — ABNORMAL LOW (ref 3.87–5.11)
RDW: 11.2 % — ABNORMAL LOW (ref 11.5–15.5)
WBC Count: 5.3 10*3/uL (ref 4.0–10.5)
nRBC: 0 % (ref 0.0–0.2)

## 2018-09-03 LAB — IRON AND TIBC
Iron: 87 ug/dL (ref 41–142)
Saturation Ratios: 27 % (ref 21–57)
TIBC: 318 ug/dL (ref 236–444)
UIBC: 231 ug/dL (ref 120–384)

## 2018-09-03 LAB — FERRITIN: Ferritin: 67 ng/mL (ref 11–307)

## 2018-09-03 LAB — LACTATE DEHYDROGENASE: LDH: 185 U/L (ref 98–192)

## 2018-09-03 MED ORDER — ATENOLOL 25 MG PO TABS: 25.0000 mg | ORAL_TABLET | Freq: Every day | ORAL | 12 refills | Status: DC

## 2018-09-03 NOTE — Progress Notes (Signed)
Hematology and Oncology Follow Up Visit  Cassandra Moses [id number] 15-Dec-1955 63 y.o. 09/03/2018   Principle Diagnosis:  Polycythemia vera-JAK2 positive  Current Therapy:   Hydrea  500mg  po q day - changed on 09/03/2018 Aspirin 81 mg by mouth daily Phlebotomy to maintain hematocrit below 45%   Interim History:  Cassandra Moses is here today for follow-up.  She is doing pretty well.  She is no longer working for [employer].  She is making it through the coronavirus pandemic.  She has had no problems with fever.  She has had no cough.  She has been on 1000 mg daily of Hydrea.  With her blood counts as they are, I think we can try to cut her down to 500 mg p.o. daily.  She does not have a family doctor.  Her blood pressure has been on the high side.  I rechecked her blood pressure today with a manual cuff.  Her blood pressure was 150/100.  I think this is just too high.  It might be high because she is in the office.  Regardless, I really think she needs to get on some antihypertensive agent.  I put her on atenolol at 25 mg p.o. daily.  She does not have a family doctor as I stated before.  She has a gynecologist who she has not seen a couple years.  We will see about making a referral for family medicine.  She has had no change in bowel or bladder habits.  She has had no rashes.  She has had no bleeding.  Overall, her performance status is ECOG 0.   Medications:  Allergies as of 09/03/2018   No Known Allergies     Medication List       Accurate as of September 03, 2018  9:14 AM. Always use your most recent med list.        aspirin 81 MG chewable tablet Chew 81 mg by mouth daily.   hydroxyurea 500 MG capsule Commonly known as:  HYDREA TAKE 1 CAPSULE BY MOUTH TWICE DAILY WITH MEALS TO MINIMIZE SIDE EFFECTS       Allergies: No Known Allergies  Past Medical History, Surgical history, Social history, and Family History were reviewed and updated.  Review of Systems:  Review of Systems  Constitutional: Negative.   HENT: Negative.   Eyes: Negative.   Respiratory: Negative.   Cardiovascular: Negative.   Genitourinary: Negative.   Musculoskeletal: Negative.   Skin: Negative.   Neurological: Negative.   Endo/Heme/Allergies: Negative.   Psychiatric/Behavioral: Negative.      Physical Exam:  vitals were not taken for this visit.   Wt Readings from Last 3 Encounters:  05/21/18 112 lb 4 oz (50.9 kg)  02/19/18 110 lb (49.9 kg)  09/20/17 116 lb (52.6 kg)    Physical Exam Vitals signs reviewed.  HENT:     Head: Normocephalic and atraumatic.  Eyes:     Pupils: Pupils are equal, round, and reactive to light.  Neck:     Musculoskeletal: Normal range of motion.  Cardiovascular:     Rate and Rhythm: Normal rate and regular rhythm.     Heart sounds: Normal heart sounds.  Pulmonary:     Effort: Pulmonary effort is normal.     Breath sounds: Normal breath sounds.  Abdominal:     General: Bowel sounds are normal.     Palpations: Abdomen is soft.  Musculoskeletal: Normal range of motion.        General: No  tenderness or deformity.  Lymphadenopathy:     Cervical: No cervical adenopathy.  Skin:    General: Skin is warm and dry.     Findings: No erythema or rash.  Neurological:     Mental Status: She is alert and oriented to person, place, and time.  Psychiatric:        Behavior: Behavior normal.        Thought Content: Thought content normal.        Judgment: Judgment normal.     Lab Results  Component Value Date   WBC 5.3 09/03/2018   HGB 14.1 09/03/2018   HCT 39.1 09/03/2018   MCV 129.0 (H) 09/03/2018   PLT 201 09/03/2018   Lab Results  Component Value Date   FERRITIN 90 05/21/2018   IRON 111 05/21/2018   TIBC 313 05/21/2018   UIBC 202 05/21/2018   IRONPCTSAT 35 05/21/2018   Lab Results  Component Value Date   RBC 3.03 (L) 09/03/2018   No results found for: KPAFRELGTCHN, LAMBDASER, KAPLAMBRATIO No results found for:  IGGSERUM, IGA, IGMSERUM No results found for: Odetta Pink, SPEI   Chemistry      Component Value Date/Time   NA 137 05/21/2018 0832   NA 143 03/04/2017 0808   NA 138 06/25/2016 1159   K 4.5 05/21/2018 0832   K 4.5 03/04/2017 0808   K 3.9 06/25/2016 1159   CL 101 05/21/2018 0832   CL 102 03/04/2017 0808   CO2 28 05/21/2018 0832   CO2 29 03/04/2017 0808   CO2 23 06/25/2016 1159   BUN 14 05/21/2018 0832   BUN 14 03/04/2017 0808   BUN 10.3 06/25/2016 1159   CREATININE 0.85 05/21/2018 0832   CREATININE 0.9 03/04/2017 0808   CREATININE 0.9 06/25/2016 1159      Component Value Date/Time   CALCIUM 9.2 05/21/2018 0832   CALCIUM 9.4 03/04/2017 0808   CALCIUM 9.7 06/25/2016 1159   ALKPHOS 82 05/21/2018 0832   ALKPHOS 86 (H) 03/04/2017 0808   ALKPHOS 96 06/25/2016 1159   AST 16 05/21/2018 0832   AST 21 06/25/2016 1159   ALT 11 05/21/2018 0832   ALT 20 03/04/2017 0808   ALT 16 06/25/2016 1159   BILITOT 0.4 05/21/2018 0832   BILITOT 0.44 06/25/2016 1159      Impression and Plan: Cassandra Moses is a very pleasant 63 yo caucasian female with polycythemia vera, JAK2 positive.   We will plan to change her Hydrea to 500 mg a day.  I think this is reasonable.  I will see her back in 3 months.  I told her that if everything looks stable in 3 months, we will get her back in 6 months afterwards.  She does not need to be phlebotomized today.  I told her to make sure she stays on the baby aspirin.    [name], MD 4/22/20209:14 AM

## 2018-09-03 NOTE — Telephone Encounter (Signed)
Appointments scheduled patient declined avs/calendar printed per 4/22 los

## 2018-09-03 NOTE — Telephone Encounter (Signed)
Left message to return call to set up new patient appointment with pcp.

## 2018-09-03 NOTE — Telephone Encounter (Signed)
-----   Message from [name], MD sent at 09/03/2018 10:00 AM EDT ----- Keane Scrape:  can you possibly see my patient for primary care?? She is very nice.  Has a good job.  She has well controlled polycythermia -- is on Hydrea.  Her BP has been too high.  I started her on Atenolol 25 mg po  day.  No rush to see her.  Thanks again!!  Please stay safe!!  Laurey Arrow

## 2018-09-04 ENCOUNTER — Other Ambulatory Visit: Payer: Self-pay | Admitting: Hematology & Oncology

## 2018-10-24 ENCOUNTER — Other Ambulatory Visit: Payer: Self-pay | Admitting: Hematology & Oncology

## 2018-12-03 ENCOUNTER — Inpatient Hospital Stay (HOSPITAL_BASED_OUTPATIENT_CLINIC_OR_DEPARTMENT_OTHER): Payer: Self-pay | Admitting: Hematology & Oncology

## 2018-12-03 ENCOUNTER — Encounter: Payer: Self-pay | Admitting: Hematology & Oncology

## 2018-12-03 ENCOUNTER — Other Ambulatory Visit: Payer: Self-pay

## 2018-12-03 ENCOUNTER — Inpatient Hospital Stay: Payer: Self-pay | Attending: Hematology & Oncology

## 2018-12-03 VITALS — BP 156/89 | HR 95 | Temp 97.5°F | Resp 16 | Wt 107.0 lb

## 2018-12-03 DIAGNOSIS — Z79899 Other long term (current) drug therapy: Secondary | ICD-10-CM | POA: Insufficient documentation

## 2018-12-03 DIAGNOSIS — D45 Polycythemia vera: Secondary | ICD-10-CM

## 2018-12-03 DIAGNOSIS — Z7982 Long term (current) use of aspirin: Secondary | ICD-10-CM

## 2018-12-03 DIAGNOSIS — I10 Essential (primary) hypertension: Secondary | ICD-10-CM | POA: Insufficient documentation

## 2018-12-03 LAB — CMP (CANCER CENTER ONLY)
ALT: 12 U/L (ref 0–44)
AST: 16 U/L (ref 15–41)
Albumin: 4 g/dL (ref 3.5–5.0)
Alkaline Phosphatase: 99 U/L (ref 38–126)
Anion gap: 8 (ref 5–15)
BUN: 13 mg/dL (ref 8–23)
CO2: 26 mmol/L (ref 22–32)
Calcium: 8.1 mg/dL — ABNORMAL LOW (ref 8.9–10.3)
Chloride: 101 mmol/L (ref 98–111)
Creatinine: 0.69 mg/dL (ref 0.44–1.00)
GFR, Est AFR Am: >60 mL/min (ref >60)
GFR, Estimated: >60 mL/min (ref >60)
Glucose, Bld: 120 mg/dL — ABNORMAL HIGH (ref 70–99)
Potassium: 4.7 mmol/L (ref 3.5–5.1)
Sodium: 135 mmol/L (ref 135–145)
Total Bilirubin: 0.3 mg/dL (ref 0.3–1.2)
Total Protein: 6.6 g/dL (ref 6.5–8.1)

## 2018-12-03 LAB — CBC WITH DIFFERENTIAL (CANCER CENTER ONLY)
Abs Immature Granulocytes: 0 10*3/uL (ref 0.00–0.07)
Basophils Absolute: 0 10*3/uL (ref 0.0–0.1)
Basophils Relative: 1 %
Eosinophils Absolute: 0.1 10*3/uL (ref 0.0–0.5)
Eosinophils Relative: 1 %
HCT: 35.6 % — ABNORMAL LOW (ref 36.0–46.0)
Hemoglobin: 12.5 g/dL (ref 12.0–15.0)
Immature Granulocytes: 0 %
Lymphocytes Relative: 54 %
Lymphs Abs: 3.4 10*3/uL (ref 0.7–4.0)
MCH: 44.8 pg — ABNORMAL HIGH (ref 26.0–34.0)
MCHC: 35.1 g/dL (ref 30.0–36.0)
MCV: 127.6 fL — ABNORMAL HIGH (ref 80.0–100.0)
Monocytes Absolute: 0.7 10*3/uL (ref 0.1–1.0)
Monocytes Relative: 12 %
Neutro Abs: 2 10*3/uL (ref 1.7–7.7)
Neutrophils Relative %: 32 %
Platelet Count: 192 10*3/uL (ref 150–400)
RBC: 2.79 MIL/uL — ABNORMAL LOW (ref 3.87–5.11)
RDW: 13.8 % (ref 11.5–15.5)
WBC Count: 6.2 10*3/uL (ref 4.0–10.5)
nRBC: 0 % (ref 0.0–0.2)

## 2018-12-03 LAB — IRON AND TIBC
Iron: 122 ug/dL (ref 41–142)
Saturation Ratios: 38 % (ref 21–57)
TIBC: 320 ug/dL (ref 236–444)
UIBC: 198 ug/dL (ref 120–384)

## 2018-12-03 LAB — FERRITIN: Ferritin: 104 ng/mL (ref 11–307)

## 2018-12-03 LAB — LACTATE DEHYDROGENASE: LDH: 206 U/L — ABNORMAL HIGH (ref 98–192)

## 2018-12-03 LAB — SAVE SMEAR (SSMR)

## 2018-12-03 MED ORDER — TELMISARTAN 40 MG PO TABS: 40.0000 mg | ORAL_TABLET | Freq: Every day | ORAL | 6 refills | Status: DC

## 2018-12-03 NOTE — Progress Notes (Signed)
Hematology and Oncology Follow Up Visit  Cassandra Moses [id number] Sep 23, 1955 63 y.o. 12/03/2018   Principle Diagnosis:  Polycythemia vera-JAK2 positive  Current Therapy:   Hydrea  500mg  po q day - changed on 09/03/2018 Aspirin 81 mg by mouth daily Phlebotomy to maintain hematocrit below 45%   Interim History:  Cassandra Moses is here today for follow-up.  She is doing pretty well.  Unfortunately, she cannot tolerate the atenolol that we put her on more last saw her.  She said that just made her feel so tired.  Her blood pressure is up.  She is yet to see the family doctor.  I will go ahead and try her on Micardis at 40 mg p.o. daily.  She somehow is taking 2 Hydrea alternating with 1 Hydrea.  Not sure why she is still doing this.  I told her to take 1 500 mg dose of Hydrea daily.  She has had no bleeding.  She has had no rashes.  There is been no leg swelling.  She is very worried about the coronavirus.  She is trying to say how much possible.  Overall, her performance status is ECOG 0.   Medications:  Allergies as of 12/03/2018   No Known Allergies     Medication List       Accurate as of December 03, 2018  9:02 AM. If you have any questions, ask your nurse or doctor.        aspirin 81 MG chewable tablet Chew 81 mg by mouth daily.   atenolol 25 MG tablet Commonly known as: Tenormin Take 1 tablet (25 mg total) by mouth daily.   hydroxyurea 500 MG capsule Commonly known as: HYDREA TAKE 1 CAPSULE BY MOUTH TWICE DAILY WITH MEALS TO  MINIMIZE  SIDE  EFFECTS What changed: See the new instructions.       Allergies: No Known Allergies  Past Medical History, Surgical history, Social history, and Family History were reviewed and updated.  Review of Systems: Review of Systems  Constitutional: Negative.   HENT: Negative.   Eyes: Negative.   Respiratory: Negative.   Cardiovascular: Negative.   Genitourinary: Negative.   Musculoskeletal: Negative.   Skin:  Negative.   Neurological: Negative.   Endo/Heme/Allergies: Negative.   Psychiatric/Behavioral: Negative.      Physical Exam:  weight is 107 lb (48.5 kg). Her oral temperature is 97.5 F (36.4 C) (abnormal). Her blood pressure is 156/89 (abnormal) and her pulse is 95. Her respiration is 16 and oxygen saturation is 99%.   Wt Readings from Last 3 Encounters:  12/03/18 107 lb (48.5 kg)  09/03/18 110 lb (49.9 kg)  05/21/18 112 lb 4 oz (50.9 kg)    Physical Exam Vitals signs reviewed.  HENT:     Head: Normocephalic and atraumatic.  Eyes:     Pupils: Pupils are equal, round, and reactive to light.  Neck:     Musculoskeletal: Normal range of motion.  Cardiovascular:     Rate and Rhythm: Normal rate and regular rhythm.     Heart sounds: Normal heart sounds.  Pulmonary:     Effort: Pulmonary effort is normal.     Breath sounds: Normal breath sounds.  Abdominal:     General: Bowel sounds are normal.     Palpations: Abdomen is soft.  Musculoskeletal: Normal range of motion.        General: No tenderness or deformity.  Lymphadenopathy:     Cervical: No cervical adenopathy.  Skin:  General: Skin is warm and dry.     Findings: No erythema or rash.  Neurological:     Mental Status: She is alert and oriented to person, place, and time.  Psychiatric:        Behavior: Behavior normal.        Thought Content: Thought content normal.        Judgment: Judgment normal.     Lab Results  Component Value Date   WBC 6.2 12/03/2018   HGB 12.5 12/03/2018   HCT 35.6 (L) 12/03/2018   MCV 127.6 (H) 12/03/2018   PLT 192 12/03/2018   Lab Results  Component Value Date   FERRITIN 67 09/03/2018   IRON 87 09/03/2018   TIBC 318 09/03/2018   UIBC 231 09/03/2018   IRONPCTSAT 27 09/03/2018   Lab Results  Component Value Date   RBC 2.79 (L) 12/03/2018   No results found for: KPAFRELGTCHN, LAMBDASER, KAPLAMBRATIO No results found for: IGGSERUM, IGA, IGMSERUM No results found for:  Odetta Pink, SPEI   Chemistry      Component Value Date/Time   NA 140 09/03/2018 0845   NA 143 03/04/2017 0808   NA 138 06/25/2016 1159   K 4.4 09/03/2018 0845   K 4.5 03/04/2017 0808   K 3.9 06/25/2016 1159   CL 102 09/03/2018 0845   CL 102 03/04/2017 0808   CO2 29 09/03/2018 0845   CO2 29 03/04/2017 0808   CO2 23 06/25/2016 1159   BUN 15 09/03/2018 0845   BUN 14 03/04/2017 0808   BUN 10.3 06/25/2016 1159   CREATININE 0.77 09/03/2018 0845   CREATININE 0.9 03/04/2017 0808   CREATININE 0.9 06/25/2016 1159      Component Value Date/Time   CALCIUM 10.0 09/03/2018 0845   CALCIUM 9.4 03/04/2017 0808   CALCIUM 9.7 06/25/2016 1159   ALKPHOS 80 09/03/2018 0845   ALKPHOS 86 (H) 03/04/2017 0808   ALKPHOS 96 06/25/2016 1159   AST 17 09/03/2018 0845   AST 21 06/25/2016 1159   ALT 14 09/03/2018 0845   ALT 20 03/04/2017 0808   ALT 16 06/25/2016 1159   BILITOT 0.4 09/03/2018 0845   BILITOT 0.44 06/25/2016 1159      Impression and Plan: Cassandra Moses is a very pleasant 63 yo caucasian female with polycythemia vera, JAK2 positive.   I think we can now get her back in 6 months.  She really wants to try to avoid going to the doctor's office as much as possible.    If she has any problems, she will always let us know.  Hopefully, she will see her family doctor in between when we see her.      [name], MD 7/22/20209:02 AM

## 2018-12-09 ENCOUNTER — Other Ambulatory Visit: Payer: Self-pay | Admitting: Hematology & Oncology

## 2018-12-09 ENCOUNTER — Other Ambulatory Visit: Payer: Self-pay | Admitting: *Deleted

## 2018-12-09 ENCOUNTER — Telehealth: Payer: Self-pay | Admitting: *Deleted

## 2018-12-09 MED ORDER — HYDROXYUREA 500 MG PO CAPS: 500.0000 mg | ORAL_CAPSULE | Freq: Every day | ORAL | 3 refills | Status: DC

## 2018-12-09 NOTE — Telephone Encounter (Signed)
Message received from [facility] with [place] requesting clarification on Hydroxyurea prescription.  Call placed back to [facility] and order given per Dr. Marin Olp to take Hydroxyurea 500 mg daily.

## 2019-04-07 ENCOUNTER — Telehealth: Payer: Self-pay | Admitting: *Deleted

## 2019-04-07 ENCOUNTER — Other Ambulatory Visit: Payer: Self-pay | Admitting: *Deleted

## 2019-04-07 MED ORDER — HYDROXYUREA 500 MG PO CAPS: 1000.0000 mg | ORAL_CAPSULE | Freq: Every day | ORAL | 3 refills | Status: DC

## 2019-04-07 NOTE — Telephone Encounter (Signed)
Message received from patient stating that she is need of a refill for Hydrea.  Pt states that she is taking 1000 mg daily of Hydrea.  Refill for Hydrea sent for 1000 mg daily per order of Dr. Marin Olp.

## 2019-06-05 ENCOUNTER — Other Ambulatory Visit: Payer: Self-pay

## 2019-06-05 ENCOUNTER — Encounter: Payer: Self-pay | Admitting: Hematology & Oncology

## 2019-06-05 ENCOUNTER — Inpatient Hospital Stay: Payer: Self-pay

## 2019-06-05 ENCOUNTER — Inpatient Hospital Stay: Payer: Self-pay | Attending: Hematology & Oncology | Admitting: Hematology & Oncology

## 2019-06-05 VITALS — BP 128/84 | HR 97 | Temp 97.9°F | Resp 18 | Ht <= 58 in | Wt 111.1 lb

## 2019-06-05 DIAGNOSIS — D45 Polycythemia vera: Secondary | ICD-10-CM | POA: Insufficient documentation

## 2019-06-05 DIAGNOSIS — Z79899 Other long term (current) drug therapy: Secondary | ICD-10-CM | POA: Insufficient documentation

## 2019-06-05 DIAGNOSIS — Z7982 Long term (current) use of aspirin: Secondary | ICD-10-CM | POA: Insufficient documentation

## 2019-06-05 LAB — CBC WITH DIFFERENTIAL (CANCER CENTER ONLY)
Abs Immature Granulocytes: 0.01 10*3/uL (ref 0.00–0.07)
Basophils Absolute: 0 10*3/uL (ref 0.0–0.1)
Basophils Relative: 0 %
Eosinophils Absolute: 0.1 10*3/uL (ref 0.0–0.5)
Eosinophils Relative: 1 %
HCT: 36.2 % (ref 36.0–46.0)
Hemoglobin: 13.4 g/dL (ref 12.0–15.0)
Immature Granulocytes: 0 %
Lymphocytes Relative: 40 %
Lymphs Abs: 2.7 10*3/uL (ref 0.7–4.0)
MCH: 48.7 pg — ABNORMAL HIGH (ref 26.0–34.0)
MCHC: 37 g/dL — ABNORMAL HIGH (ref 30.0–36.0)
MCV: 131.6 fL — ABNORMAL HIGH (ref 80.0–100.0)
Monocytes Absolute: 0.7 10*3/uL (ref 0.1–1.0)
Monocytes Relative: 11 %
Neutro Abs: 3.2 10*3/uL (ref 1.7–7.7)
Neutrophils Relative %: 48 %
Platelet Count: 184 10*3/uL (ref 150–400)
RBC: 2.75 MIL/uL — ABNORMAL LOW (ref 3.87–5.11)
RDW: 11.9 % (ref 11.5–15.5)
WBC Count: 6.7 10*3/uL (ref 4.0–10.5)
nRBC: 0 % (ref 0.0–0.2)

## 2019-06-05 LAB — CMP (CANCER CENTER ONLY)
ALT: 15 U/L (ref 0–44)
AST: 18 U/L (ref 15–41)
Albumin: 4.6 g/dL (ref 3.5–5.0)
Alkaline Phosphatase: 80 U/L (ref 38–126)
Anion gap: 9 (ref 5–15)
BUN: 16 mg/dL (ref 8–23)
CO2: 27 mmol/L (ref 22–32)
Calcium: 9.7 mg/dL (ref 8.9–10.3)
Chloride: 100 mmol/L (ref 98–111)
Creatinine: 0.82 mg/dL (ref 0.44–1.00)
GFR, Est AFR Am: >60 mL/min (ref >60)
GFR, Estimated: >60 mL/min (ref >60)
Glucose, Bld: 143 mg/dL — ABNORMAL HIGH (ref 70–99)
Potassium: 4.5 mmol/L (ref 3.5–5.1)
Sodium: 136 mmol/L (ref 135–145)
Total Bilirubin: 0.4 mg/dL (ref 0.3–1.2)
Total Protein: 7.5 g/dL (ref 6.5–8.1)

## 2019-06-05 LAB — SAVE SMEAR(SSMR), FOR PROVIDER SLIDE REVIEW

## 2019-06-05 LAB — LACTATE DEHYDROGENASE: LDH: 211 U/L — ABNORMAL HIGH (ref 98–192)

## 2019-06-05 NOTE — Progress Notes (Signed)
Hematology and Oncology Follow Up Visit  CLEMETINE ODENS [id number] 1956/05/03 64 y.o. 06/05/2019   Principle Diagnosis:  Polycythemia vera-JAK2 positive  Current Therapy:   Hydrea  1000mg  po q day - changed on 12/03/2018 Aspirin 81 mg by mouth daily Phlebotomy to maintain hematocrit below 45%   Interim History:  [name] is here today for follow-up.  She is doing pretty well.  I am quite happy for her right now.  I really think she is doing quite well.  She is tolerating the 1000 mg daily dose of Hydrea.  Her father is now living with her.  He is 58 years old.  Thankfully, he really does not have any dimension so that is 1 thing that she does not have to worry about.  She is trying to stay home to be with him.  Because of this, she is really not able to work outside of the house.  She has had no problems with nausea or vomiting.  There is been no fever.  There is no cough.  She is very diligent with avoiding the coronavirus.  She has had no rashes.  There is been no change in bowel or bladder habits.  Overall, her performance status is ECOG 0.   Medications:  Allergies as of 06/05/2019   No Known Allergies     Medication List       Accurate as of June 05, 2019 10:09 AM. If you have any questions, ask your nurse or doctor.        aspirin 81 MG chewable tablet Chew 81 mg by mouth daily.   hydroxyurea 500 MG capsule Commonly known as: HYDREA Take 2 capsules (1,000 mg total) by mouth daily.   telmisartan 40 MG tablet Commonly known as: Micardis Take 1 tablet (40 mg total) by mouth daily.       Allergies: No Known Allergies  Past Medical History, Surgical history, Social history, and Family History were reviewed and updated.  Review of Systems: Review of Systems  Constitutional: Negative.   HENT: Negative.   Eyes: Negative.   Respiratory: Negative.   Cardiovascular: Negative.   Genitourinary: Negative.   Musculoskeletal: Negative.   Skin:  Negative.   Neurological: Negative.   Endo/Heme/Allergies: Negative.   Psychiatric/Behavioral: Negative.      Physical Exam:  height is 4\' 10"  (1.473 m) and weight is 111 lb 1.3 oz (50.4 kg). Her temporal temperature is 97.9 F (36.6 C). Her blood pressure is 128/84 and her pulse is 97. Her respiration is 18 and oxygen saturation is 100%.   Wt Readings from Last 3 Encounters:  06/05/19 111 lb 1.3 oz (50.4 kg)  12/03/18 107 lb (48.5 kg)  09/03/18 110 lb (49.9 kg)    Physical Exam Vitals reviewed.  HENT:     Head: Normocephalic and atraumatic.  Eyes:     Pupils: Pupils are equal, round, and reactive to light.  Cardiovascular:     Rate and Rhythm: Normal rate and regular rhythm.     Heart sounds: Normal heart sounds.  Pulmonary:     Effort: Pulmonary effort is normal.     Breath sounds: Normal breath sounds.  Abdominal:     General: Bowel sounds are normal.     Palpations: Abdomen is soft.  Musculoskeletal:        General: No tenderness or deformity. Normal range of motion.     Cervical back: Normal range of motion.  Lymphadenopathy:     Cervical: No cervical adenopathy.  Skin:    General: Skin is warm and dry.     Findings: No erythema or rash.  Neurological:     Mental Status: She is alert and oriented to person, place, and time.  Psychiatric:        Behavior: Behavior normal.        Thought Content: Thought content normal.        Judgment: Judgment normal.     Lab Results  Component Value Date   WBC 6.7 06/05/2019   HGB 13.4 06/05/2019   HCT 36.2 06/05/2019   MCV 131.6 (H) 06/05/2019   PLT 184 06/05/2019   Lab Results  Component Value Date   FERRITIN 104 12/03/2018   IRON 122 12/03/2018   TIBC 320 12/03/2018   UIBC 198 12/03/2018   IRONPCTSAT 38 12/03/2018   Lab Results  Component Value Date   RBC 2.75 (L) 06/05/2019   No results found for: KPAFRELGTCHN, LAMBDASER, KAPLAMBRATIO No results found for: Kandis Cocking, IGMSERUM No results found for:  Odetta Pink, SPEI   Chemistry      Component Value Date/Time   NA 136 06/05/2019 0904   NA 143 03/04/2017 0808   NA 138 06/25/2016 1159   K 4.5 06/05/2019 0904   K 4.5 03/04/2017 0808   K 3.9 06/25/2016 1159   CL 100 06/05/2019 0904   CL 102 03/04/2017 0808   CO2 27 06/05/2019 0904   CO2 29 03/04/2017 0808   CO2 23 06/25/2016 1159   BUN 16 06/05/2019 0904   BUN 14 03/04/2017 0808   BUN 10.3 06/25/2016 1159   CREATININE 0.82 06/05/2019 0904   CREATININE 0.9 03/04/2017 0808   CREATININE 0.9 06/25/2016 1159      Component Value Date/Time   CALCIUM 9.7 06/05/2019 0904   CALCIUM 9.4 03/04/2017 0808   CALCIUM 9.7 06/25/2016 1159   ALKPHOS 80 06/05/2019 0904   ALKPHOS 86 (H) 03/04/2017 0808   ALKPHOS 96 06/25/2016 1159   AST 18 06/05/2019 0904   AST 21 06/25/2016 1159   ALT 15 06/05/2019 0904   ALT 20 03/04/2017 0808   ALT 16 06/25/2016 1159   BILITOT 0.4 06/05/2019 0904   BILITOT 0.44 06/25/2016 1159      Impression and Plan: [name] is a very pleasant 64 yo caucasian female with polycythemia vera, JAK2 positive.   I think we can now get her back in 6 months.  Given that her blood counts are holding nice and stable, I do think that 6 months would be fine.  She certainly knows that she can always call us and we can see her sooner if there are any problems.  Ms. Ojeda is always fun to talk to.  She has some experiences.  She is very eloquent.   [name], MD 1/22/202110:09 AM

## 2019-08-17 ENCOUNTER — Other Ambulatory Visit: Payer: Self-pay | Admitting: Hematology & Oncology

## 2019-10-16 ENCOUNTER — Other Ambulatory Visit: Payer: Self-pay | Admitting: Hematology & Oncology

## 2019-11-16 ENCOUNTER — Other Ambulatory Visit: Payer: Self-pay | Admitting: Hematology & Oncology

## 2019-12-03 ENCOUNTER — Inpatient Hospital Stay: Payer: Self-pay

## 2019-12-03 ENCOUNTER — Inpatient Hospital Stay: Payer: Self-pay | Attending: Hematology & Oncology | Admitting: Hematology & Oncology

## 2019-12-03 ENCOUNTER — Telehealth: Payer: Self-pay | Admitting: Hematology & Oncology

## 2019-12-03 ENCOUNTER — Encounter: Payer: Self-pay | Admitting: Hematology & Oncology

## 2019-12-03 ENCOUNTER — Other Ambulatory Visit: Payer: Self-pay

## 2019-12-03 VITALS — BP 162/86 | HR 70 | Temp 97.7°F | Resp 16 | Wt 116.0 lb

## 2019-12-03 DIAGNOSIS — Z79899 Other long term (current) drug therapy: Secondary | ICD-10-CM | POA: Insufficient documentation

## 2019-12-03 DIAGNOSIS — Z7982 Long term (current) use of aspirin: Secondary | ICD-10-CM | POA: Insufficient documentation

## 2019-12-03 DIAGNOSIS — D45 Polycythemia vera: Secondary | ICD-10-CM | POA: Insufficient documentation

## 2019-12-03 LAB — CBC WITH DIFFERENTIAL (CANCER CENTER ONLY)
Abs Immature Granulocytes: 0.02 10*3/uL (ref 0.00–0.07)
Basophils Absolute: 0 10*3/uL (ref 0.0–0.1)
Basophils Relative: 0 %
Eosinophils Absolute: 0.1 10*3/uL (ref 0.0–0.5)
Eosinophils Relative: 1 %
HCT: 36.4 % (ref 36.0–46.0)
Hemoglobin: 13.3 g/dL (ref 12.0–15.0)
Immature Granulocytes: 0 %
Lymphocytes Relative: 29 %
Lymphs Abs: 2.3 10*3/uL (ref 0.7–4.0)
MCH: 49.6 pg — ABNORMAL HIGH (ref 26.0–34.0)
MCHC: 36.5 g/dL — ABNORMAL HIGH (ref 30.0–36.0)
MCV: 135.8 fL — ABNORMAL HIGH (ref 80.0–100.0)
Monocytes Absolute: 0.8 10*3/uL (ref 0.1–1.0)
Monocytes Relative: 10 %
Neutro Abs: 4.6 10*3/uL (ref 1.7–7.7)
Neutrophils Relative %: 60 %
Platelet Count: 185 10*3/uL (ref 150–400)
RBC: 2.68 MIL/uL — ABNORMAL LOW (ref 3.87–5.11)
RDW: 11.3 % — ABNORMAL LOW (ref 11.5–15.5)
WBC Count: 7.7 10*3/uL (ref 4.0–10.5)
nRBC: 0 % (ref 0.0–0.2)

## 2019-12-03 LAB — CMP (CANCER CENTER ONLY)
ALT: 11 U/L (ref 0–44)
AST: 16 U/L (ref 15–41)
Albumin: 4.8 g/dL (ref 3.5–5.0)
Alkaline Phosphatase: 86 U/L (ref 38–126)
Anion gap: 7 (ref 5–15)
BUN: 16 mg/dL (ref 8–23)
CO2: 28 mmol/L (ref 22–32)
Calcium: 10.5 mg/dL — ABNORMAL HIGH (ref 8.9–10.3)
Chloride: 103 mmol/L (ref 98–111)
Creatinine: 0.84 mg/dL (ref 0.44–1.00)
GFR, Est AFR Am: >60 mL/min (ref >60)
GFR, Estimated: >60 mL/min (ref >60)
Glucose, Bld: 142 mg/dL — ABNORMAL HIGH (ref 70–99)
Potassium: 5.7 mmol/L — ABNORMAL HIGH (ref 3.5–5.1)
Sodium: 138 mmol/L (ref 135–145)
Total Bilirubin: 0.5 mg/dL (ref 0.3–1.2)
Total Protein: 7.7 g/dL (ref 6.5–8.1)

## 2019-12-03 LAB — LACTATE DEHYDROGENASE: LDH: 236 U/L — ABNORMAL HIGH (ref 98–192)

## 2019-12-03 LAB — SAVE SMEAR(SSMR), FOR PROVIDER SLIDE REVIEW

## 2019-12-03 NOTE — Progress Notes (Signed)
Hematology and Oncology Follow Up Visit  Cassandra Moses [id number] 01/01/1956 64 y.o. 12/03/2019   Principle Diagnosis:  Polycythemia vera-JAK2 positive  Current Therapy:   Hydrea  1000mg  po q day - changed on 12/03/2018 Aspirin 81 mg by mouth daily Phlebotomy to maintain hematocrit below 45%   Interim History:  Cassandra Moses is here today for follow-up.  So far, here things going fairly well for her.  She is certainly busy trying to help her dad.  He is with her.  He does have a Dietitian coming to help out.  There has been no problems with the hydroxyurea.  She is also taking baby aspirin.  She read about polycythemia vera.  I told her that she does not have that much of a high risk for stroke or heart attack or blood clots because her hematocrit is very well controlled and her white blood cell count is also well controlled.  Her blood pressure is not doing well right now.  It was quite high in the office.  However, she says at home and the blood pressure is usually under 130/90.  She has had no change in bowel or bladder habits.  She has had no cough.  She has had no fever.   Overall, her performance status is ECOG 0.   Medications:  Allergies as of 12/03/2019   No Known Allergies     Medication List       Accurate as of December 03, 2019 10:19 AM. If you have any questions, ask your nurse or doctor.        aspirin 81 MG chewable tablet Chew 81 mg by mouth daily.   hydroxyurea 500 MG capsule Commonly known as: HYDREA TAKE TWO CAPSULES BY MOUTH DAILY   Naproxen Sodium 220 MG Caps Take 440 mg by mouth daily.   telmisartan 40 MG tablet Commonly known as: MICARDIS TAKE ONE TABLET BY MOUTH DAILY       Allergies: No Known Allergies  Past Medical History, Surgical history, Social history, and Family History were reviewed and updated.  Review of Systems: Review of Systems  Constitutional: Negative.   HENT: Negative.   Eyes: Negative.   Respiratory:  Negative.   Cardiovascular: Negative.   Genitourinary: Negative.   Musculoskeletal: Negative.   Skin: Negative.   Neurological: Negative.   Endo/Heme/Allergies: Negative.   Psychiatric/Behavioral: Negative.      Physical Exam:  weight is 116 lb (52.6 kg). Her oral temperature is 97.7 F (36.5 C). Her blood pressure is 162/86 (abnormal) and her pulse is 70. Her respiration is 16 and oxygen saturation is 100%.   Wt Readings from Last 3 Encounters:  12/03/19 116 lb (52.6 kg)  06/05/19 111 lb 1.3 oz (50.4 kg)  12/03/18 107 lb (48.5 kg)    Physical Exam Vitals reviewed.  HENT:     Head: Normocephalic and atraumatic.  Eyes:     Pupils: Pupils are equal, round, and reactive to light.  Cardiovascular:     Rate and Rhythm: Normal rate and regular rhythm.     Heart sounds: Normal heart sounds.  Pulmonary:     Effort: Pulmonary effort is normal.     Breath sounds: Normal breath sounds.  Abdominal:     General: Bowel sounds are normal.     Palpations: Abdomen is soft.  Musculoskeletal:        General: No tenderness or deformity. Normal range of motion.     Cervical back: Normal range of motion.  Lymphadenopathy:  Cervical: No cervical adenopathy.  Skin:    General: Skin is warm and dry.     Findings: No erythema or rash.  Neurological:     Mental Status: She is alert and oriented to person, place, and time.  Psychiatric:        Behavior: Behavior normal.        Thought Content: Thought content normal.        Judgment: Judgment normal.     Lab Results  Component Value Date   WBC 7.7 12/03/2019   HGB 13.3 12/03/2019   HCT 36.4 12/03/2019   MCV 135.8 (H) 12/03/2019   PLT 185 12/03/2019   Lab Results  Component Value Date   FERRITIN 104 12/03/2018   IRON 122 12/03/2018   TIBC 320 12/03/2018   UIBC 198 12/03/2018   IRONPCTSAT 38 12/03/2018   Lab Results  Component Value Date   RBC 2.68 (L) 12/03/2019   No results found for: KPAFRELGTCHN, LAMBDASER,  KAPLAMBRATIO No results found for: Kandis Cocking, IGMSERUM No results found for: Odetta Pink, SPEI   Chemistry      Component Value Date/Time   NA 138 12/03/2019 0908   NA 143 03/04/2017 0808   NA 138 06/25/2016 1159   K 5.7 (H) 12/03/2019 0908   K 4.5 03/04/2017 0808   K 3.9 06/25/2016 1159   CL 103 12/03/2019 0908   CL 102 03/04/2017 0808   CO2 28 12/03/2019 0908   CO2 29 03/04/2017 0808   CO2 23 06/25/2016 1159   BUN 16 12/03/2019 0908   BUN 14 03/04/2017 0808   BUN 10.3 06/25/2016 1159   CREATININE 0.84 12/03/2019 0908   CREATININE 0.9 03/04/2017 0808   CREATININE 0.9 06/25/2016 1159      Component Value Date/Time   CALCIUM 10.5 (H) 12/03/2019 0908   CALCIUM 9.4 03/04/2017 0808   CALCIUM 9.7 06/25/2016 1159   ALKPHOS 86 12/03/2019 0908   ALKPHOS 86 (H) 03/04/2017 0808   ALKPHOS 96 06/25/2016 1159   AST 16 12/03/2019 0908   AST 21 06/25/2016 1159   ALT 11 12/03/2019 0908   ALT 20 03/04/2017 0808   ALT 16 06/25/2016 1159   BILITOT 0.5 12/03/2019 0908   BILITOT 0.44 06/25/2016 1159      Impression and Plan: Cassandra Moses is a very pleasant 64 yo caucasian female with polycythemia vera, JAK2 positive.   I I hope that her blood pressure is actually doing well.  I think this will be her biggest risk for any type of cardiovascular event or cerebrovascular event.  I really do not think that the polycythemia is going to be that much of a high risk for her.  I will plan to get her back in 6 months.    [name], MD 7/22/202110:19 AM

## 2019-12-03 NOTE — Telephone Encounter (Signed)
Appointments scheduled patient will get updates from My Chart per 7/22 los

## 2020-01-18 ENCOUNTER — Other Ambulatory Visit: Payer: Self-pay | Admitting: Hematology & Oncology

## 2020-03-17 ENCOUNTER — Other Ambulatory Visit: Payer: Self-pay | Admitting: Hematology & Oncology

## 2020-05-17 ENCOUNTER — Other Ambulatory Visit: Payer: Self-pay | Admitting: Hematology & Oncology

## 2020-06-01 ENCOUNTER — Telehealth: Payer: Self-pay

## 2020-06-01 NOTE — Telephone Encounter (Signed)
returned pts call to r/s appt from 1/21 due to possible snow, I recd her vm which was unavail, I went ahead and r/s the appt and she can view on my chart  Christophe Rising

## 2020-06-02 ENCOUNTER — Telehealth: Payer: Self-pay | Admitting: Hematology & Oncology

## 2020-06-02 NOTE — Telephone Encounter (Signed)
Returned call to patient regarding her 1/21 att needing to be reschedule per her request.  She was given new date of 2/2.  She was ok with both date & time

## 2020-06-03 ENCOUNTER — Other Ambulatory Visit: Payer: Medicaid Other

## 2020-06-03 ENCOUNTER — Ambulatory Visit: Payer: Medicaid Other | Admitting: Hematology & Oncology

## 2020-06-15 ENCOUNTER — Telehealth: Payer: Self-pay | Admitting: *Deleted

## 2020-06-15 ENCOUNTER — Encounter: Payer: Self-pay | Admitting: Hematology & Oncology

## 2020-06-15 ENCOUNTER — Inpatient Hospital Stay: Payer: Self-pay | Attending: Hematology & Oncology

## 2020-06-15 ENCOUNTER — Other Ambulatory Visit: Payer: Self-pay

## 2020-06-15 ENCOUNTER — Inpatient Hospital Stay (HOSPITAL_BASED_OUTPATIENT_CLINIC_OR_DEPARTMENT_OTHER): Payer: Self-pay | Admitting: Hematology & Oncology

## 2020-06-15 VITALS — BP 160/82 | HR 98 | Temp 98.4°F | Resp 16 | Wt 116.0 lb

## 2020-06-15 DIAGNOSIS — D45 Polycythemia vera: Secondary | ICD-10-CM

## 2020-06-15 DIAGNOSIS — Z79899 Other long term (current) drug therapy: Secondary | ICD-10-CM | POA: Insufficient documentation

## 2020-06-15 DIAGNOSIS — R030 Elevated blood-pressure reading, without diagnosis of hypertension: Secondary | ICD-10-CM | POA: Insufficient documentation

## 2020-06-15 LAB — CBC WITH DIFFERENTIAL (CANCER CENTER ONLY)
Abs Immature Granulocytes: 0.01 10*3/uL (ref 0.00–0.07)
Basophils Absolute: 0 10*3/uL (ref 0.0–0.1)
Basophils Relative: 0 %
Eosinophils Absolute: 0.1 10*3/uL (ref 0.0–0.5)
Eosinophils Relative: 1 %
HCT: 36.9 % (ref 36.0–46.0)
Hemoglobin: 13.3 g/dL (ref 12.0–15.0)
Immature Granulocytes: 0 %
Lymphocytes Relative: 51 %
Lymphs Abs: 3.2 10*3/uL (ref 0.7–4.0)
MCH: 46.7 pg — ABNORMAL HIGH (ref 26.0–34.0)
MCHC: 36 g/dL (ref 30.0–36.0)
MCV: 129.5 fL — ABNORMAL HIGH (ref 80.0–100.0)
Monocytes Absolute: 0.7 10*3/uL (ref 0.1–1.0)
Monocytes Relative: 11 %
Neutro Abs: 2.4 10*3/uL (ref 1.7–7.7)
Neutrophils Relative %: 37 %
Platelet Count: 188 10*3/uL (ref 150–400)
RBC: 2.85 MIL/uL — ABNORMAL LOW (ref 3.87–5.11)
RDW: 11.9 % (ref 11.5–15.5)
WBC Count: 6.5 10*3/uL (ref 4.0–10.5)
nRBC: 0 % (ref 0.0–0.2)

## 2020-06-15 LAB — CMP (CANCER CENTER ONLY)
ALT: 12 U/L (ref 0–44)
AST: 15 U/L (ref 15–41)
Albumin: 4.5 g/dL (ref 3.5–5.0)
Alkaline Phosphatase: 82 U/L (ref 38–126)
Anion gap: 8 (ref 5–15)
BUN: 17 mg/dL (ref 8–23)
CO2: 27 mmol/L (ref 22–32)
Calcium: 10.1 mg/dL (ref 8.9–10.3)
Chloride: 99 mmol/L (ref 98–111)
Creatinine: 0.81 mg/dL (ref 0.44–1.00)
GFR, Estimated: >60 mL/min (ref >60)
Glucose, Bld: 96 mg/dL (ref 70–99)
Potassium: 4.8 mmol/L (ref 3.5–5.1)
Sodium: 134 mmol/L — ABNORMAL LOW (ref 135–145)
Total Bilirubin: 0.4 mg/dL (ref 0.3–1.2)
Total Protein: 7.2 g/dL (ref 6.5–8.1)

## 2020-06-15 LAB — SAVE SMEAR(SSMR), FOR PROVIDER SLIDE REVIEW

## 2020-06-15 LAB — LACTATE DEHYDROGENASE: LDH: 190 U/L (ref 98–192)

## 2020-06-15 NOTE — Telephone Encounter (Signed)
Per los 06/15/20 patient aware of appointment

## 2020-06-15 NOTE — Progress Notes (Signed)
Hematology and Oncology Follow Up Visit  Cassandra Moses [id number] [birth year] 65 y.o. 06/15/2020   Principle Diagnosis:  Polycythemia vera-JAK2 positive  Current Therapy:   Hydrea  1000mg  po q day - changed on 12/03/2018 Aspirin 81 mg by mouth daily Phlebotomy to maintain hematocrit below 45%   Interim History:  Cassandra Moses is here today for follow-up.  We see her every 6 months.  Since we last saw her she been doing okay.  She had no problems over the holiday season.  She is doing well with the Hydrea.  I think the dose is really perfect for her.  Her blood counts are holding stable.  She has had no issues with nausea or vomiting.  She has had no problems with the coronavirus.  She is still very cautious with being around a lot of people.  She has had no change in bowel or bladder habits.  She has never had a colonoscopy.  I told her that this really would be helpful for her.  She just is not confident that she will be able to go through 1.  Thankfully, there is no history of colon cancer in the family.  She has had no issues with cough or shortness of breath.  She has had no rashes.  She has had no issues with headache.  Overall, her performance status is ECOG 1.     Medications:  Allergies as of 06/15/2020   No Known Allergies     Medication List       Accurate as of June 15, 2020  2:46 PM. If you have any questions, ask your nurse or doctor.        aspirin 81 MG chewable tablet Chew 81 mg by mouth daily.   hydroxyurea 500 MG capsule Commonly known as: HYDREA TAKE TWO CAPSULES BY MOUTH DAILY   Naproxen Sodium 220 MG Caps Take 440 mg by mouth daily.   telmisartan 40 MG tablet Commonly known as: MICARDIS TAKE ONE TABLET BY MOUTH DAILY       Allergies: No Known Allergies  Past Medical History, Surgical history, Social history, and Family History were reviewed and updated.  Review of Systems: Review of Systems  Constitutional: Negative.   HENT:  Negative.   Eyes: Negative.   Respiratory: Negative.   Cardiovascular: Negative.   Genitourinary: Negative.   Musculoskeletal: Negative.   Skin: Negative.   Neurological: Negative.   Endo/Heme/Allergies: Negative.   Psychiatric/Behavioral: Negative.      Physical Exam:  vitals were not taken for this visit.   Wt Readings from Last 3 Encounters:  12/03/19 116 lb (52.6 kg)  06/05/19 111 lb 1.3 oz (50.4 kg)  12/03/18 107 lb (48.5 kg)    Physical Exam Vitals reviewed.  HENT:     Head: Normocephalic and atraumatic.  Eyes:     Pupils: Pupils are equal, round, and reactive to light.  Cardiovascular:     Rate and Rhythm: Normal rate and regular rhythm.     Heart sounds: Normal heart sounds.  Pulmonary:     Effort: Pulmonary effort is normal.     Breath sounds: Normal breath sounds.  Abdominal:     General: Bowel sounds are normal.     Palpations: Abdomen is soft.  Musculoskeletal:        General: No tenderness or deformity. Normal range of motion.     Cervical back: Normal range of motion.  Lymphadenopathy:     Cervical: No cervical adenopathy.  Skin:  General: Skin is warm and dry.     Findings: No erythema or rash.  Neurological:     Mental Status: She is alert and oriented to person, place, and time.  Psychiatric:        Behavior: Behavior normal.        Thought Content: Thought content normal.        Judgment: Judgment normal.     Lab Results  Component Value Date   WBC 6.5 06/15/2020   HGB 13.3 06/15/2020   HCT 36.9 06/15/2020   MCV 129.5 (H) 06/15/2020   PLT 188 06/15/2020   Lab Results  Component Value Date   FERRITIN 104 12/03/2018   IRON 122 12/03/2018   TIBC 320 12/03/2018   UIBC 198 12/03/2018   IRONPCTSAT 38 12/03/2018   Lab Results  Component Value Date   RBC 2.85 (L) 06/15/2020   No results found for: KPAFRELGTCHN, LAMBDASER, KAPLAMBRATIO No results found for: Kandis Cocking, IGMSERUM No results found for: Odetta Pink, SPEI   Chemistry      Component Value Date/Time   NA 138 12/03/2019 0908   NA 143 03/04/2017 0808   NA 138 06/25/2016 1159   K 5.7 (H) 12/03/2019 0908   K 4.5 03/04/2017 0808   K 3.9 06/25/2016 1159   CL 103 12/03/2019 0908   CL 102 03/04/2017 0808   CO2 28 12/03/2019 0908   CO2 29 03/04/2017 0808   CO2 23 06/25/2016 1159   BUN 16 12/03/2019 0908   BUN 14 03/04/2017 0808   BUN 10.3 06/25/2016 1159   CREATININE 0.84 12/03/2019 0908   CREATININE 0.9 03/04/2017 0808   CREATININE 0.9 06/25/2016 1159      Component Value Date/Time   CALCIUM 10.5 (H) 12/03/2019 0908   CALCIUM 9.4 03/04/2017 0808   CALCIUM 9.7 06/25/2016 1159   ALKPHOS 86 12/03/2019 0908   ALKPHOS 86 (H) 03/04/2017 0808   ALKPHOS 96 06/25/2016 1159   AST 16 12/03/2019 0908   AST 21 06/25/2016 1159   ALT 11 12/03/2019 0908   ALT 20 03/04/2017 0808   ALT 16 06/25/2016 1159   BILITOT 0.5 12/03/2019 0908   BILITOT 0.44 06/25/2016 1159      Impression and Plan: Cassandra Moses is a very pleasant 65 yo caucasian female with polycythemia vera, JAK2 positive.   Everything really looks fine.  I see that her blood pressure is on the higher side.  Again, she checks her blood pressure at home.  She said it is never that high.  We will plan for another 10-month follow-up.  I know that she will do well.   Cassandra Napoleon, MD 2/2/20222:46 PM

## 2020-06-16 LAB — IRON AND TIBC
Iron: 129 ug/dL (ref 41–142)
Saturation Ratios: 38 % (ref 21–57)
TIBC: 340 ug/dL (ref 236–444)
UIBC: 211 ug/dL (ref 120–384)

## 2020-06-16 LAB — FERRITIN: Ferritin: 63 ng/mL (ref 11–307)

## 2020-07-15 ENCOUNTER — Other Ambulatory Visit: Payer: Self-pay | Admitting: Hematology & Oncology

## 2020-08-15 ENCOUNTER — Other Ambulatory Visit: Payer: Self-pay | Admitting: Hematology & Oncology

## 2020-09-14 ENCOUNTER — Other Ambulatory Visit: Payer: Self-pay | Admitting: Hematology & Oncology

## 2020-11-15 ENCOUNTER — Other Ambulatory Visit: Payer: Self-pay | Admitting: Hematology & Oncology

## 2020-12-13 ENCOUNTER — Inpatient Hospital Stay: Payer: Medicaid Other | Attending: Hematology & Oncology

## 2020-12-13 ENCOUNTER — Telehealth: Payer: Self-pay | Admitting: *Deleted

## 2020-12-13 ENCOUNTER — Encounter: Payer: Self-pay | Admitting: Hematology & Oncology

## 2020-12-13 ENCOUNTER — Other Ambulatory Visit: Payer: Self-pay

## 2020-12-13 ENCOUNTER — Inpatient Hospital Stay (HOSPITAL_BASED_OUTPATIENT_CLINIC_OR_DEPARTMENT_OTHER): Payer: Self-pay | Admitting: Hematology & Oncology

## 2020-12-13 VITALS — BP 141/96 | HR 101 | Temp 97.9°F | Resp 18 | Wt 115.5 lb

## 2020-12-13 DIAGNOSIS — Z79899 Other long term (current) drug therapy: Secondary | ICD-10-CM | POA: Insufficient documentation

## 2020-12-13 DIAGNOSIS — R030 Elevated blood-pressure reading, without diagnosis of hypertension: Secondary | ICD-10-CM | POA: Insufficient documentation

## 2020-12-13 DIAGNOSIS — D45 Polycythemia vera: Secondary | ICD-10-CM | POA: Insufficient documentation

## 2020-12-13 LAB — CMP (CANCER CENTER ONLY)
ALT: 17 U/L (ref 0–44)
AST: 19 U/L (ref 15–41)
Albumin: 4.4 g/dL (ref 3.5–5.0)
Alkaline Phosphatase: 93 U/L (ref 38–126)
Anion gap: 9 (ref 5–15)
BUN: 16 mg/dL (ref 8–23)
CO2: 27 mmol/L (ref 22–32)
Calcium: 9.9 mg/dL (ref 8.9–10.3)
Chloride: 99 mmol/L (ref 98–111)
Creatinine: 0.77 mg/dL (ref 0.44–1.00)
GFR, Estimated: >60 mL/min (ref >60)
Glucose, Bld: 101 mg/dL — ABNORMAL HIGH (ref 70–99)
Potassium: 5 mmol/L (ref 3.5–5.1)
Sodium: 135 mmol/L (ref 135–145)
Total Bilirubin: 0.5 mg/dL (ref 0.3–1.2)
Total Protein: 7.3 g/dL (ref 6.5–8.1)

## 2020-12-13 LAB — CBC WITH DIFFERENTIAL (CANCER CENTER ONLY)
Abs Immature Granulocytes: 0.03 10*3/uL (ref 0.00–0.07)
Basophils Absolute: 0 10*3/uL (ref 0.0–0.1)
Basophils Relative: 0 %
Eosinophils Absolute: 0.1 10*3/uL (ref 0.0–0.5)
Eosinophils Relative: 1 %
HCT: 38.5 % (ref 36.0–46.0)
Hemoglobin: 14.2 g/dL (ref 12.0–15.0)
Immature Granulocytes: 0 %
Lymphocytes Relative: 34 %
Lymphs Abs: 2.3 10*3/uL (ref 0.7–4.0)
MCH: 46.9 pg — ABNORMAL HIGH (ref 26.0–34.0)
MCHC: 36.9 g/dL — ABNORMAL HIGH (ref 30.0–36.0)
MCV: 127.1 fL — ABNORMAL HIGH (ref 80.0–100.0)
Monocytes Absolute: 0.8 10*3/uL (ref 0.1–1.0)
Monocytes Relative: 12 %
Neutro Abs: 3.6 10*3/uL (ref 1.7–7.7)
Neutrophils Relative %: 53 %
Platelet Count: 176 10*3/uL (ref 150–400)
RBC: 3.03 MIL/uL — ABNORMAL LOW (ref 3.87–5.11)
RDW: 11.9 % (ref 11.5–15.5)
WBC Count: 6.8 10*3/uL (ref 4.0–10.5)
nRBC: 0 % (ref 0.0–0.2)

## 2020-12-13 LAB — SAVE SMEAR(SSMR), FOR PROVIDER SLIDE REVIEW

## 2020-12-13 LAB — LACTATE DEHYDROGENASE: LDH: 185 U/L (ref 98–192)

## 2020-12-13 NOTE — Progress Notes (Signed)
Hematology and Oncology Follow Up Visit  COLUMBINE ACRE [id number] Jan 27, 1956 65 y.o. 12/13/2020   Principle Diagnosis:  Polycythemia vera-JAK2 positive  Current Therapy:   Hydrea  '1000mg'$  po q day - changed on 12/03/2018 Aspirin 81 mg by mouth daily Phlebotomy to maintain hematocrit below 45%   Interim History:  Ms. Shope is here today for follow-up.  We see her every 6 months.  Since we last saw her she been doing okay.  She been busy doing yard work.  She has had no issues with respect to nausea or vomiting.  She has had all of her vaccines and boosters for the COVID.  She has had no rashes.  There has been no leg swelling.  She has had no headache.  She has had no chest wall pain.  She has had no change in bowel or bladder habits.  Overall, I would have to say her performance status is probably ECOG 0.      Medications:  Allergies as of 12/13/2020   No Known Allergies      Medication List        Accurate as of December 13, 2020 10:59 AM. If you have any questions, ask your nurse or doctor.          aspirin 81 MG chewable tablet Chew 81 mg by mouth daily.   hydroxyurea 500 MG capsule Commonly known as: HYDREA TAKE TWO CAPSULES BY MOUTH DAILY   Naproxen Sodium 220 MG Caps Take 440 mg by mouth daily.   telmisartan 40 MG tablet Commonly known as: MICARDIS TAKE ONE TABLET BY MOUTH DAILY        Allergies: No Known Allergies  Past Medical History, Surgical history, Social history, and Family History were reviewed and updated.  Review of Systems: Review of Systems  Constitutional: Negative.   HENT: Negative.    Eyes: Negative.   Respiratory: Negative.    Cardiovascular: Negative.   Genitourinary: Negative.   Musculoskeletal: Negative.   Skin: Negative.   Neurological: Negative.   Endo/Heme/Allergies: Negative.   Psychiatric/Behavioral: Negative.      Physical Exam:  weight is 115 lb 8 oz (52.4 kg). Her oral temperature is 97.9 F (36.6 C).  Her blood pressure is 141/96 (abnormal) and her pulse is 101 (abnormal). Her respiration is 18 and oxygen saturation is 100%.   Wt Readings from Last 3 Encounters:  12/13/20 115 lb 8 oz (52.4 kg)  06/15/20 116 lb (52.6 kg)  12/03/19 116 lb (52.6 kg)    Physical Exam Vitals reviewed.  HENT:     Head: Normocephalic and atraumatic.  Eyes:     Pupils: Pupils are equal, round, and reactive to light.  Cardiovascular:     Rate and Rhythm: Normal rate and regular rhythm.     Heart sounds: Normal heart sounds.  Pulmonary:     Effort: Pulmonary effort is normal.     Breath sounds: Normal breath sounds.  Abdominal:     General: Bowel sounds are normal.     Palpations: Abdomen is soft.  Musculoskeletal:        General: No tenderness or deformity. Normal range of motion.     Cervical back: Normal range of motion.  Lymphadenopathy:     Cervical: No cervical adenopathy.  Skin:    General: Skin is warm and dry.     Findings: No erythema or rash.  Neurological:     Mental Status: She is alert and oriented to person, place, and time.  Psychiatric:  Behavior: Behavior normal.        Thought Content: Thought content normal.        Judgment: Judgment normal.    Lab Results  Component Value Date   WBC 6.8 12/13/2020   HGB 14.2 12/13/2020   HCT 38.5 12/13/2020   MCV 127.1 (H) 12/13/2020   PLT 176 12/13/2020   Lab Results  Component Value Date   FERRITIN 63 06/15/2020   IRON 129 06/15/2020   TIBC 340 06/15/2020   UIBC 211 06/15/2020   IRONPCTSAT 38 06/15/2020   Lab Results  Component Value Date   RBC 3.03 (L) 12/13/2020   No results found for: KPAFRELGTCHN, LAMBDASER, KAPLAMBRATIO No results found for: IGGSERUM, IGA, IGMSERUM No results found for: Odetta Pink, SPEI   Chemistry      Component Value Date/Time   NA 135 12/13/2020 0954   NA 143 03/04/2017 0808   NA 138 06/25/2016 1159   K 5.0 12/13/2020 0954   K 4.5  03/04/2017 0808   K 3.9 06/25/2016 1159   CL 99 12/13/2020 0954   CL 102 03/04/2017 0808   CO2 27 12/13/2020 0954   CO2 29 03/04/2017 0808   CO2 23 06/25/2016 1159   BUN 16 12/13/2020 0954   BUN 14 03/04/2017 0808   BUN 10.3 06/25/2016 1159   CREATININE 0.77 12/13/2020 0954   CREATININE 0.9 03/04/2017 0808   CREATININE 0.9 06/25/2016 1159      Component Value Date/Time   CALCIUM 9.9 12/13/2020 0954   CALCIUM 9.4 03/04/2017 0808   CALCIUM 9.7 06/25/2016 1159   ALKPHOS 93 12/13/2020 0954   ALKPHOS 86 (H) 03/04/2017 0808   ALKPHOS 96 06/25/2016 1159   AST 19 12/13/2020 0954   AST 21 06/25/2016 1159   ALT 17 12/13/2020 0954   ALT 20 03/04/2017 0808   ALT 16 06/25/2016 1159   BILITOT 0.5 12/13/2020 0954   BILITOT 0.44 06/25/2016 1159      Impression and Plan: Ms. Onofrey is a very pleasant 65yo caucasian female with polycythemia vera, JAK2 positive.   Everything really looks fine.  Her blood pressure is always on the higher side.  She says this happens when she comes to visit [place].  I have to believe that she is holding relatively stable.  I did look at her blood smear under the microscope.  I do not see anything that looked "activity" for the polycythemia.  I think we can now move her appointments to every 8 months.  This will get her through the holidays and wintertime.    [name], MD 8/2/202210:59 AM

## 2020-12-13 NOTE — Telephone Encounter (Signed)
Per 12/13/20 los - gave upcoming appointments - confirmed

## 2020-12-15 LAB — IRON AND TIBC
Iron: 138 ug/dL (ref 41–142)
Saturation Ratios: 40 % (ref 21–57)
TIBC: 343 ug/dL (ref 236–444)
UIBC: 204 ug/dL (ref 120–384)

## 2020-12-15 LAB — FERRITIN: Ferritin: 67 ng/mL (ref 11–307)

## 2021-01-13 ENCOUNTER — Other Ambulatory Visit: Payer: Self-pay | Admitting: Hematology & Oncology

## 2021-03-12 ENCOUNTER — Other Ambulatory Visit: Payer: Self-pay | Admitting: Hematology & Oncology

## 2021-05-16 ENCOUNTER — Other Ambulatory Visit: Payer: Self-pay | Admitting: Hematology & Oncology

## 2021-06-14 ENCOUNTER — Other Ambulatory Visit: Payer: Self-pay | Admitting: Hematology & Oncology

## 2021-06-15 ENCOUNTER — Encounter: Payer: Self-pay | Admitting: Hematology & Oncology

## 2021-07-13 ENCOUNTER — Telehealth: Payer: Self-pay | Admitting: *Deleted

## 2021-07-13 ENCOUNTER — Inpatient Hospital Stay (HOSPITAL_BASED_OUTPATIENT_CLINIC_OR_DEPARTMENT_OTHER): Payer: Self-pay | Admitting: Hematology & Oncology

## 2021-07-13 ENCOUNTER — Inpatient Hospital Stay: Payer: Medicaid Other | Attending: Hematology & Oncology

## 2021-07-13 ENCOUNTER — Encounter: Payer: Self-pay | Admitting: Hematology & Oncology

## 2021-07-13 ENCOUNTER — Other Ambulatory Visit: Payer: Self-pay

## 2021-07-13 VITALS — BP 149/93 | HR 105 | Temp 98.2°F | Resp 16 | Wt 115.0 lb

## 2021-07-13 DIAGNOSIS — Z79899 Other long term (current) drug therapy: Secondary | ICD-10-CM | POA: Insufficient documentation

## 2021-07-13 DIAGNOSIS — D45 Polycythemia vera: Secondary | ICD-10-CM

## 2021-07-13 DIAGNOSIS — Z7982 Long term (current) use of aspirin: Secondary | ICD-10-CM | POA: Insufficient documentation

## 2021-07-13 LAB — CBC WITH DIFFERENTIAL (CANCER CENTER ONLY)
Abs Immature Granulocytes: 0.02 10*3/uL (ref 0.00–0.07)
Basophils Absolute: 0 10*3/uL (ref 0.0–0.1)
Basophils Relative: 1 %
Eosinophils Absolute: 0.1 10*3/uL (ref 0.0–0.5)
Eosinophils Relative: 1 %
HCT: 37.8 % (ref 36.0–46.0)
Hemoglobin: 14 g/dL (ref 12.0–15.0)
Immature Granulocytes: 0 %
Lymphocytes Relative: 30 %
Lymphs Abs: 2 10*3/uL (ref 0.7–4.0)
MCH: 47.3 pg — ABNORMAL HIGH (ref 26.0–34.0)
MCHC: 37 g/dL — ABNORMAL HIGH (ref 30.0–36.0)
MCV: 127.7 fL — ABNORMAL HIGH (ref 80.0–100.0)
Monocytes Absolute: 0.8 10*3/uL (ref 0.1–1.0)
Monocytes Relative: 12 %
Neutro Abs: 3.6 10*3/uL (ref 1.7–7.7)
Neutrophils Relative %: 56 %
Platelet Count: 378 10*3/uL (ref 150–400)
RBC: 2.96 MIL/uL — ABNORMAL LOW (ref 3.87–5.11)
RDW: 12.6 % (ref 11.5–15.5)
WBC Count: 6.4 10*3/uL (ref 4.0–10.5)
nRBC: 0 % (ref 0.0–0.2)

## 2021-07-13 LAB — CMP (CANCER CENTER ONLY)
ALT: 11 U/L (ref 0–44)
AST: 14 U/L — ABNORMAL LOW (ref 15–41)
Albumin: 4 g/dL (ref 3.5–5.0)
Alkaline Phosphatase: 102 U/L (ref 38–126)
Anion gap: 6 (ref 5–15)
BUN: 10 mg/dL (ref 8–23)
CO2: 29 mmol/L (ref 22–32)
Calcium: 9.3 mg/dL (ref 8.9–10.3)
Chloride: 101 mmol/L (ref 98–111)
Creatinine: 0.78 mg/dL (ref 0.44–1.00)
GFR, Estimated: >60 mL/min (ref >60)
Glucose, Bld: 106 mg/dL — ABNORMAL HIGH (ref 70–99)
Potassium: 5.1 mmol/L (ref 3.5–5.1)
Sodium: 136 mmol/L (ref 135–145)
Total Bilirubin: 0.6 mg/dL (ref 0.3–1.2)
Total Protein: 7 g/dL (ref 6.5–8.1)

## 2021-07-13 LAB — LACTATE DEHYDROGENASE: LDH: 191 U/L (ref 98–192)

## 2021-07-13 LAB — SAVE SMEAR(SSMR), FOR PROVIDER SLIDE REVIEW

## 2021-07-13 NOTE — Progress Notes (Signed)
?Hematology and Oncology Follow Up Visit ? ?Lona Kettle Tompkins ?[id number] ?Aug 06, 1955 66 y.o. ?07/13/2021 ? ? ?Principle Diagnosis:  ?Polycythemia vera-JAK2 positive ? ?Current Therapy:   ?Hydrea  1000mg  po q day - changed on 12/03/2018 ?Aspirin 81 mg by mouth daily ?Phlebotomy to maintain hematocrit below 45% ?  ?Interim History:  Ms. Suhre is here today for follow-up.  We saw her 8 months ago.  Since then, she is been doing pretty well.  She had no problems over the holidays although her pipes froze when we had that cold weather right before Christmas. ? ?She has been active.  She works outside in the yard. ? ?She has had no problems with fever.  There is been no problems with COVID.  She has had no problems with nausea or vomiting.  Has been no change in bowel or bladder habits.  She has had no rashes.  She has had no pain.  Is been no hot flashes or sweats. ? ?Overall, I would say her performance status is probably ECOG 0.  ? ?Medications:  ?Allergies as of 07/13/2021   ?No Known Allergies ?  ? ?  ?Medication List  ?  ? ?  ? Accurate as of July 13, 2021 10:40 AM. If you have any questions, ask your nurse or doctor.  ?  ?  ? ?  ? ?aspirin 81 MG chewable tablet ?Chew 81 mg by mouth daily. ?  ?hydroxyurea 500 MG capsule ?Commonly known as: HYDREA ?TAKE TWO CAPSULES BY MOUTH DAILY ?  ?Naproxen Sodium 220 MG Caps ?Take 440 mg by mouth daily. ?  ?telmisartan 40 MG tablet ?Commonly known as: MICARDIS ?TAKE ONE TABLET BY MOUTH DAILY ?  ? ?  ? ? ?Allergies: No Known Allergies ? ?Past Medical History, Surgical history, Social history, and Family History were reviewed and updated. ? ?Review of Systems: ?Review of Systems  ?Constitutional: Negative.   ?HENT: Negative.    ?Eyes: Negative.   ?Respiratory: Negative.    ?Cardiovascular: Negative.   ?Genitourinary: Negative.   ?Musculoskeletal: Negative.   ?Skin: Negative.   ?Neurological: Negative.   ?Endo/Heme/Allergies: Negative.   ?Psychiatric/Behavioral: Negative.     ? ? ?Physical Exam: ? weight is 115 lb (52.2 kg). Her oral temperature is 98.2 ?F (36.8 ?C). Her blood pressure is 149/93 (abnormal) and her pulse is 105 (abnormal). Her respiration is 16 and oxygen saturation is 99%.  ? ?Wt Readings from Last 3 Encounters:  ?07/13/21 115 lb (52.2 kg)  ?12/13/20 115 lb 8 oz (52.4 kg)  ?06/15/20 116 lb (52.6 kg)  ? ? ?Physical Exam ?Vitals reviewed.  ?HENT:  ?   Head: Normocephalic and atraumatic.  ?Eyes:  ?   Pupils: Pupils are equal, round, and reactive to light.  ?Cardiovascular:  ?   Rate and Rhythm: Normal rate and regular rhythm.  ?   Heart sounds: Normal heart sounds.  ?Pulmonary:  ?   Effort: Pulmonary effort is normal.  ?   Breath sounds: Normal breath sounds.  ?Abdominal:  ?   General: Bowel sounds are normal.  ?   Palpations: Abdomen is soft.  ?Musculoskeletal:     ?   General: No tenderness or deformity. Normal range of motion.  ?   Cervical back: Normal range of motion.  ?Lymphadenopathy:  ?   Cervical: No cervical adenopathy.  ?Skin: ?   General: Skin is warm and dry.  ?   Findings: No erythema or rash.  ?Neurological:  ?   Mental Status: She  is alert and oriented to person, place, and time.  ?Psychiatric:     ?   Behavior: Behavior normal.     ?   Thought Content: Thought content normal.     ?   Judgment: Judgment normal.  ? ? ?Lab Results  ?Component Value Date  ? WBC 6.4 07/13/2021  ? HGB 14.0 07/13/2021  ? HCT 37.8 07/13/2021  ? MCV 127.7 (H) 07/13/2021  ? PLT 378 07/13/2021  ? ?Lab Results  ?Component Value Date  ? FERRITIN 67 12/13/2020  ? IRON 138 12/13/2020  ? TIBC 343 12/13/2020  ? UIBC 204 12/13/2020  ? IRONPCTSAT 40 12/13/2020  ? ?Lab Results  ?Component Value Date  ? RBC 2.96 (L) 07/13/2021  ? ?No results found for: KPAFRELGTCHN, LAMBDASER, KAPLAMBRATIO ?No results found for: IGGSERUM, IGA, IGMSERUM ?No results found for: TOTALPROTELP, ALBUMINELP, A1GS, A2GS, BETS, BETA2SER, GAMS, MSPIKE, SPEI ?  Chemistry   ?   ?Component Value Date/Time  ? NA 136  07/13/2021 0949  ? NA 143 03/04/2017 0808  ? NA 138 06/25/2016 1159  ? K 5.1 07/13/2021 0949  ? K 4.5 03/04/2017 0808  ? K 3.9 06/25/2016 1159  ? CL 101 07/13/2021 0949  ? CL 102 03/04/2017 0808  ? CO2 29 07/13/2021 0949  ? CO2 29 03/04/2017 0808  ? CO2 23 06/25/2016 1159  ? BUN 10 07/13/2021 0949  ? BUN 14 03/04/2017 0808  ? BUN 10.3 06/25/2016 1159  ? CREATININE 0.78 07/13/2021 0949  ? CREATININE 0.9 03/04/2017 0808  ? CREATININE 0.9 06/25/2016 1159  ?    ?Component Value Date/Time  ? CALCIUM 9.3 07/13/2021 0949  ? CALCIUM 9.4 03/04/2017 0808  ? CALCIUM 9.7 06/25/2016 1159  ? ALKPHOS 102 07/13/2021 0949  ? ALKPHOS 86 (H) 03/04/2017 6269  ? ALKPHOS 96 06/25/2016 1159  ? AST 14 (L) 07/13/2021 0949  ? AST 21 06/25/2016 1159  ? ALT 11 07/13/2021 0949  ? ALT 20 03/04/2017 0808  ? ALT 16 06/25/2016 1159  ? BILITOT 0.6 07/13/2021 0949  ? BILITOT 0.44 06/25/2016 1159  ?  ? ? ?Impression and Plan: Ms. Wargo is a very pleasant 66 year old Caucasian female with polycythemia vera.  The blood disorder is JAK2 positive.    ? ?Everything really looks fine.  Her blood pressure is always on the higher side.  She says this happens when she comes to visit [place]. ? ?I have to believe that she is holding relatively stable.  I did look at her blood smear under the microscope.  I do not see anything that looked "activity" for the polycythemia. ? ?We will plan to get her back in 8 months.  I think this is reasonable since her blood counts are holding steady.   ? ?[name], MD ?3/2/202310:40 AM ? ?

## 2021-07-13 NOTE — Telephone Encounter (Signed)
Per 07/13/21 los - gave upcoming appointments - confirmed ?

## 2021-07-14 ENCOUNTER — Other Ambulatory Visit: Payer: Self-pay | Admitting: Hematology & Oncology

## 2021-09-12 ENCOUNTER — Other Ambulatory Visit: Payer: Self-pay | Admitting: Hematology & Oncology

## 2021-11-24 ENCOUNTER — Other Ambulatory Visit: Payer: Self-pay | Admitting: Hematology & Oncology

## 2022-03-05 ENCOUNTER — Encounter: Payer: Self-pay | Admitting: Hematology & Oncology

## 2022-03-15 ENCOUNTER — Inpatient Hospital Stay (HOSPITAL_BASED_OUTPATIENT_CLINIC_OR_DEPARTMENT_OTHER): Payer: BLUE CROSS/BLUE SHIELD | Admitting: Medical Oncology

## 2022-03-15 ENCOUNTER — Telehealth: Payer: Self-pay | Admitting: *Deleted

## 2022-03-15 ENCOUNTER — Inpatient Hospital Stay: Payer: BLUE CROSS/BLUE SHIELD | Attending: Hematology & Oncology

## 2022-03-15 VITALS — BP 138/87 | HR 102 | Temp 97.9°F | Resp 17 | Wt 108.0 lb

## 2022-03-15 DIAGNOSIS — Z79899 Other long term (current) drug therapy: Secondary | ICD-10-CM | POA: Diagnosis not present

## 2022-03-15 DIAGNOSIS — D45 Polycythemia vera: Secondary | ICD-10-CM

## 2022-03-15 DIAGNOSIS — R5383 Other fatigue: Secondary | ICD-10-CM | POA: Insufficient documentation

## 2022-03-15 LAB — IRON AND IRON BINDING CAPACITY (CC-WL,HP ONLY)
Iron: 200 ug/dL — ABNORMAL HIGH (ref 28–170)
Saturation Ratios: 54 % — ABNORMAL HIGH (ref 10.4–31.8)
TIBC: 371 ug/dL (ref 250–450)
UIBC: 171 ug/dL (ref 148–442)

## 2022-03-15 LAB — CBC WITH DIFFERENTIAL (CANCER CENTER ONLY)
Abs Immature Granulocytes: 0.02 10*3/uL (ref 0.00–0.07)
Basophils Absolute: 0 10*3/uL (ref 0.0–0.1)
Basophils Relative: 0 %
Eosinophils Absolute: 0 10*3/uL (ref 0.0–0.5)
Eosinophils Relative: 0 %
HCT: 35.1 % — ABNORMAL LOW (ref 36.0–46.0)
Hemoglobin: 13 g/dL (ref 12.0–15.0)
Immature Granulocytes: 0 %
Lymphocytes Relative: 28 %
Lymphs Abs: 1.4 10*3/uL (ref 0.7–4.0)
MCH: 50.8 pg — ABNORMAL HIGH (ref 26.0–34.0)
MCHC: 37 g/dL — ABNORMAL HIGH (ref 30.0–36.0)
MCV: 137.1 fL — ABNORMAL HIGH (ref 80.0–100.0)
Monocytes Absolute: 0.5 10*3/uL (ref 0.1–1.0)
Monocytes Relative: 11 %
Neutro Abs: 2.9 10*3/uL (ref 1.7–7.7)
Neutrophils Relative %: 61 %
Platelet Count: 367 10*3/uL (ref 150–400)
RBC: 2.56 MIL/uL — ABNORMAL LOW (ref 3.87–5.11)
RDW: 13.7 % (ref 11.5–15.5)
WBC Count: 4.8 10*3/uL (ref 4.0–10.5)
nRBC: 0 % (ref 0.0–0.2)

## 2022-03-15 LAB — CMP (CANCER CENTER ONLY)
ALT: 10 U/L (ref 0–44)
AST: 16 U/L (ref 15–41)
Albumin: 4.7 g/dL (ref 3.5–5.0)
Alkaline Phosphatase: 103 U/L (ref 38–126)
Anion gap: 9 (ref 5–15)
BUN: 14 mg/dL (ref 8–23)
CO2: 27 mmol/L (ref 22–32)
Calcium: 10.2 mg/dL (ref 8.9–10.3)
Chloride: 98 mmol/L (ref 98–111)
Creatinine: 0.88 mg/dL (ref 0.44–1.00)
GFR, Estimated: >60 mL/min (ref >60)
Glucose, Bld: 102 mg/dL — ABNORMAL HIGH (ref 70–99)
Potassium: 5.1 mmol/L (ref 3.5–5.1)
Sodium: 134 mmol/L — ABNORMAL LOW (ref 135–145)
Total Bilirubin: 0.6 mg/dL (ref 0.3–1.2)
Total Protein: 7.8 g/dL (ref 6.5–8.1)

## 2022-03-15 LAB — FERRITIN: Ferritin: 83 ng/mL (ref 11–307)

## 2022-03-15 LAB — SAVE SMEAR(SSMR), FOR PROVIDER SLIDE REVIEW

## 2022-03-15 LAB — LACTATE DEHYDROGENASE: LDH: 216 U/L — ABNORMAL HIGH (ref 98–192)

## 2022-03-15 NOTE — Telephone Encounter (Signed)
Per 03/15/22 los - gave upcoming appointments - confirmed

## 2022-03-15 NOTE — Progress Notes (Signed)
Hematology and Oncology Follow Up Visit  Cassandra Moses [id number] 28-Feb-1956 66 y.o. 03/15/2022   Principle Diagnosis:  Polycythemia vera-JAK2 positive  Current Therapy:   Hydrea  '1000mg'$  po q day - changed on 12/03/2018 Aspirin 81 mg by mouth daily Phlebotomy to maintain hematocrit below 45%   Interim History:  Cassandra Moses is here today for follow-up.  We saw her 8 months ago.    She reports that she has been doing well since that time. No headaches, bleeding, blood clots. She continues to take her hydrea and asa. Hydrea is tolerated fairly well with mild fatigue.   She has had no problems with fever.   She has had no problems with nausea or vomiting.  Has been no change in bowel or bladder habits.  She has had no rashes.  She has had no pain.  Is been no hot flashes or sweats.  Overall, I would say her performance status is probably ECOG 0.   Medications:  Allergies as of 03/15/2022   No Known Allergies      Medication List        Accurate as of March 15, 2022 11:24 AM. If you have any questions, ask your nurse or doctor.          aspirin 81 MG chewable tablet Chew 81 mg by mouth daily.   hydroxyurea 500 MG capsule Commonly known as: HYDREA TAKE TWO CAPSULES BY MOUTH DAILY   Naproxen Sodium 220 MG Caps Take 440 mg by mouth daily.   telmisartan 40 MG tablet Commonly known as: MICARDIS TAKE ONE TABLET BY MOUTH DAILY        Allergies: No Known Allergies  Past Medical History, Surgical history, Social history, and Family History were reviewed and updated.  Review of Systems: Review of Systems  Constitutional: Negative.   HENT: Negative.    Eyes: Negative.   Respiratory: Negative.    Cardiovascular: Negative.   Genitourinary: Negative.   Musculoskeletal: Negative.   Skin: Negative.   Neurological: Negative.   Endo/Heme/Allergies: Negative.   Psychiatric/Behavioral: Negative.       Physical Exam:  vitals were not taken for this visit.    Wt Readings from Last 3 Encounters:  07/13/21 115 lb (52.2 kg)  12/13/20 115 lb 8 oz (52.4 kg)  06/15/20 116 lb (52.6 kg)    Physical Exam Vitals reviewed.  HENT:     Head: Normocephalic and atraumatic.  Eyes:     Pupils: Pupils are equal, round, and reactive to light.  Cardiovascular:     Rate and Rhythm: Normal rate and regular rhythm.     Heart sounds: Normal heart sounds.  Pulmonary:     Effort: Pulmonary effort is normal.     Breath sounds: Normal breath sounds.  Abdominal:     General: Bowel sounds are normal.     Palpations: Abdomen is soft.  Musculoskeletal:        General: No tenderness or deformity. Normal range of motion.     Cervical back: Normal range of motion.  Lymphadenopathy:     Cervical: No cervical adenopathy.  Skin:    General: Skin is warm and dry.     Findings: No erythema or rash.  Neurological:     Mental Status: She is alert and oriented to person, place, and time.  Psychiatric:        Behavior: Behavior normal.        Thought Content: Thought content normal.  Judgment: Judgment normal.     Lab Results  Component Value Date   WBC 6.4 07/13/2021   HGB 14.0 07/13/2021   HCT 37.8 07/13/2021   MCV 127.7 (H) 07/13/2021   PLT 378 07/13/2021   Lab Results  Component Value Date   FERRITIN 67 12/13/2020   IRON 138 12/13/2020   TIBC 343 12/13/2020   UIBC 204 12/13/2020   IRONPCTSAT 40 12/13/2020   Lab Results  Component Value Date   RBC 2.96 (L) 07/13/2021   No results found for: "KPAFRELGTCHN", "LAMBDASER", "KAPLAMBRATIO" No results found for: "IGGSERUM", "IGA", "IGMSERUM" No results found for: "TOTALPROTELP", "ALBUMINELP", "A1GS", "A2GS", "BETS", "BETA2SER", "GAMS", "MSPIKE", "SPEI"   Chemistry      Component Value Date/Time   NA 136 07/13/2021 0949   NA 143 03/04/2017 0808   NA 138 06/25/2016 1159   K 5.1 07/13/2021 0949   K 4.5 03/04/2017 0808   K 3.9 06/25/2016 1159   CL 101 07/13/2021 0949   CL 102 03/04/2017 0808    CO2 29 07/13/2021 0949   CO2 29 03/04/2017 0808   CO2 23 06/25/2016 1159   BUN 10 07/13/2021 0949   BUN 14 03/04/2017 0808   BUN 10.3 06/25/2016 1159   CREATININE 0.78 07/13/2021 0949   CREATININE 0.9 03/04/2017 0808   CREATININE 0.9 06/25/2016 1159      Component Value Date/Time   CALCIUM 9.3 07/13/2021 0949   CALCIUM 9.4 03/04/2017 0808   CALCIUM 9.7 06/25/2016 1159   ALKPHOS 102 07/13/2021 0949   ALKPHOS 86 (H) 03/04/2017 0808   ALKPHOS 96 06/25/2016 1159   AST 14 (L) 07/13/2021 0949   AST 21 06/25/2016 1159   ALT 11 07/13/2021 0949   ALT 20 03/04/2017 0808   ALT 16 06/25/2016 1159   BILITOT 0.6 07/13/2021 0949   BILITOT 0.44 06/25/2016 1159      Impression and Plan: Cassandra Moses is a very pleasant 65 year old Caucasian female with polycythemia vera.  The blood disorder is JAK2 positive.     Chronic and stable. Reviewed her labs from today which are stable. No dose adjustment is needed in her medication. No phlebotomy needed today as her HCT is within target (35.1)  We will plan to get her back in 9 months per patient request. This one month extension is reasonable given her stable labs. Reviewed red flags that would indicate she should be seen sooner.   Hughie Closs, PA-C 11/2/202311:24 AM

## 2022-05-15 ENCOUNTER — Encounter: Payer: Self-pay | Admitting: Hematology & Oncology

## 2022-12-05 ENCOUNTER — Other Ambulatory Visit: Payer: Self-pay | Admitting: Hematology & Oncology

## 2022-12-06 ENCOUNTER — Encounter: Payer: Self-pay | Admitting: Hematology & Oncology

## 2022-12-09 ENCOUNTER — Other Ambulatory Visit: Payer: Self-pay | Admitting: Hematology & Oncology

## 2022-12-10 ENCOUNTER — Encounter: Payer: Self-pay | Admitting: Hematology & Oncology

## 2022-12-13 ENCOUNTER — Inpatient Hospital Stay (HOSPITAL_BASED_OUTPATIENT_CLINIC_OR_DEPARTMENT_OTHER): Payer: No Typology Code available for payment source | Admitting: Medical Oncology

## 2022-12-13 ENCOUNTER — Inpatient Hospital Stay: Payer: No Typology Code available for payment source | Attending: Hematology & Oncology

## 2022-12-13 VITALS — BP 155/75 | HR 95 | Resp 17 | Wt 103.0 lb

## 2022-12-13 DIAGNOSIS — Z79899 Other long term (current) drug therapy: Secondary | ICD-10-CM | POA: Insufficient documentation

## 2022-12-13 DIAGNOSIS — R5383 Other fatigue: Secondary | ICD-10-CM | POA: Insufficient documentation

## 2022-12-13 DIAGNOSIS — Z7982 Long term (current) use of aspirin: Secondary | ICD-10-CM | POA: Diagnosis not present

## 2022-12-13 DIAGNOSIS — D45 Polycythemia vera: Secondary | ICD-10-CM | POA: Insufficient documentation

## 2022-12-13 LAB — CMP (CANCER CENTER ONLY)
ALT: 15 U/L (ref 0–44)
AST: 18 U/L (ref 15–41)
Albumin: 4.4 g/dL (ref 3.5–5.0)
Alkaline Phosphatase: 104 U/L (ref 38–126)
Anion gap: 10 (ref 5–15)
BUN: 15 mg/dL (ref 8–23)
CO2: 26 mmol/L (ref 22–32)
Calcium: 9.6 mg/dL (ref 8.9–10.3)
Chloride: 103 mmol/L (ref 98–111)
Creatinine: 0.87 mg/dL (ref 0.44–1.00)
GFR, Estimated: >60 mL/min (ref >60)
Glucose, Bld: 119 mg/dL — ABNORMAL HIGH (ref 70–99)
Potassium: 4.4 mmol/L (ref 3.5–5.1)
Sodium: 139 mmol/L (ref 135–145)
Total Bilirubin: 0.6 mg/dL (ref 0.3–1.2)
Total Protein: 7.5 g/dL (ref 6.5–8.1)

## 2022-12-13 LAB — CBC WITH DIFFERENTIAL/PLATELET
Abs Immature Granulocytes: 0.01 10*3/uL (ref 0.00–0.07)
Basophils Absolute: 0 10*3/uL (ref 0.0–0.1)
Basophils Relative: 0 %
Eosinophils Absolute: 0.1 10*3/uL (ref 0.0–0.5)
Eosinophils Relative: 1 %
HCT: 35.9 % — ABNORMAL LOW (ref 36.0–46.0)
Hemoglobin: 13.2 g/dL (ref 12.0–15.0)
Immature Granulocytes: 0 %
Lymphocytes Relative: 40 %
Lymphs Abs: 1.8 10*3/uL (ref 0.7–4.0)
MCH: 51 pg — ABNORMAL HIGH (ref 26.0–34.0)
MCHC: 36.8 g/dL — ABNORMAL HIGH (ref 30.0–36.0)
MCV: 138.6 fL — ABNORMAL HIGH (ref 80.0–100.0)
Monocytes Absolute: 0.5 10*3/uL (ref 0.1–1.0)
Monocytes Relative: 12 %
Neutro Abs: 2.1 10*3/uL (ref 1.7–7.7)
Neutrophils Relative %: 47 %
Platelets: 414 10*3/uL — ABNORMAL HIGH (ref 150–400)
RBC: 2.59 MIL/uL — ABNORMAL LOW (ref 3.87–5.11)
RDW: 12.9 % (ref 11.5–15.5)
WBC: 4.5 10*3/uL (ref 4.0–10.5)
nRBC: 0 % (ref 0.0–0.2)

## 2022-12-13 LAB — IRON AND IRON BINDING CAPACITY (CC-WL,HP ONLY)
Iron: 182 ug/dL — ABNORMAL HIGH (ref 28–170)
Saturation Ratios: 58 % — ABNORMAL HIGH (ref 10.4–31.8)
TIBC: 315 ug/dL (ref 250–450)
UIBC: 133 ug/dL — ABNORMAL LOW (ref 148–442)

## 2022-12-13 LAB — SAVE SMEAR(SSMR), FOR PROVIDER SLIDE REVIEW

## 2022-12-13 LAB — LACTATE DEHYDROGENASE: LDH: 193 U/L — ABNORMAL HIGH (ref 98–192)

## 2022-12-13 LAB — FERRITIN: Ferritin: 70 ng/mL (ref 11–307)

## 2022-12-13 NOTE — Progress Notes (Signed)
Hematology and Oncology Follow Up Visit  Cassandra Moses [id number] 1955/12/08 67 y.o. 12/13/2022   Principle Diagnosis:  Polycythemia vera-JAK2 positive  Current Therapy:   Hydrea  1000mg  po q day - changed on 12/03/2018 Aspirin 81 mg by mouth daily Phlebotomy to maintain hematocrit below 45%   Interim History:  Cassandra Moses is here today for follow-up.  Her last visit with our office was on 03/15/2022.  She reports that she has been doing well. No concerns since her last visit.   No headaches, bleeding, blood clots. She continues to take her hydrea and asa. Hydrea is tolerated fairly well with mild fatigue.   She has had no problems with fever.   She has had no problems with nausea or vomiting.  Has been no change in bowel or bladder habits.  She has had no rashes.  She has had no pain.  Is been no hot flashes or sweats.  Overall, I would say her performance status is probably ECOG 0.  Wt Readings from Last 3 Encounters:  12/13/22 103 lb (46.7 kg)  03/15/22 108 lb (49 kg)  07/13/21 115 lb (52.2 kg)     Medications:  Allergies as of 12/13/2022   No Known Allergies      Medication List        Accurate as of December 13, 2022 11:13 AM. If you have any questions, ask your nurse or doctor.          aspirin 81 MG chewable tablet Chew 81 mg by mouth daily.   hydroxyurea 500 MG capsule Commonly known as: HYDREA TAKE 2 CAPSULES BY MOUTH DAILY   Naproxen Sodium 220 MG Caps Take 440 mg by mouth daily.   telmisartan 40 MG tablet Commonly known as: MICARDIS TAKE ONE TABLET BY MOUTH DAILY        Allergies: No Known Allergies  Past Medical History, Surgical history, Social history, and Family History were reviewed and updated.  Review of Systems: Review of Systems  Constitutional: Negative.   HENT: Negative.    Eyes: Negative.   Respiratory: Negative.    Cardiovascular: Negative.   Genitourinary: Negative.   Musculoskeletal: Negative.   Skin: Negative.    Neurological: Negative.   Endo/Heme/Allergies: Negative.   Psychiatric/Behavioral: Negative.       Physical Exam:  weight is 103 lb (46.7 kg). Her blood pressure is 155/75 (abnormal) and her pulse is 95. Her respiration is 17 and oxygen saturation is 98%.   Wt Readings from Last 3 Encounters:  12/13/22 103 lb (46.7 kg)  03/15/22 108 lb (49 kg)  07/13/21 115 lb (52.2 kg)    Physical Exam Vitals reviewed.  HENT:     Head: Normocephalic and atraumatic.  Eyes:     Pupils: Pupils are equal, round, and reactive to light.  Cardiovascular:     Rate and Rhythm: Normal rate and regular rhythm.     Heart sounds: Normal heart sounds.  Pulmonary:     Effort: Pulmonary effort is normal.     Breath sounds: Normal breath sounds.  Abdominal:     General: Bowel sounds are normal.     Palpations: Abdomen is soft.  Musculoskeletal:        General: No tenderness or deformity. Normal range of motion.     Cervical back: Normal range of motion.  Lymphadenopathy:     Cervical: No cervical adenopathy.  Skin:    General: Skin is warm and dry.     Findings: No erythema or  rash.  Neurological:     Mental Status: She is alert and oriented to person, place, and time.  Psychiatric:        Behavior: Behavior normal.        Thought Content: Thought content normal.        Judgment: Judgment normal.     Lab Results  Component Value Date   WBC 4.5 12/13/2022   HGB 13.2 12/13/2022   HCT 35.9 (L) 12/13/2022   MCV 138.6 (H) 12/13/2022   PLT 414 (H) 12/13/2022   Lab Results  Component Value Date   FERRITIN 83 03/15/2022   IRON 200 (H) 03/15/2022   TIBC 371 03/15/2022   UIBC 171 03/15/2022   IRONPCTSAT 54 (H) 03/15/2022   Lab Results  Component Value Date   RBC 2.59 (L) 12/13/2022   No results found for: "KPAFRELGTCHN", "LAMBDASER", "KAPLAMBRATIO" No results found for: "IGGSERUM", "IGA", "IGMSERUM" No results found for: "TOTALPROTELP", "ALBUMINELP", "A1GS", "A2GS", "BETS", "BETA2SER",  "GAMS", "MSPIKE", "SPEI"   Chemistry      Component Value Date/Time   NA 139 12/13/2022 1006   NA 143 03/04/2017 0808   NA 138 06/25/2016 1159   K 4.4 12/13/2022 1006   K 4.5 03/04/2017 0808   K 3.9 06/25/2016 1159   CL 103 12/13/2022 1006   CL 102 03/04/2017 0808   CO2 26 12/13/2022 1006   CO2 29 03/04/2017 0808   CO2 23 06/25/2016 1159   BUN 15 12/13/2022 1006   BUN 14 03/04/2017 0808   BUN 10.3 06/25/2016 1159   CREATININE 0.87 12/13/2022 1006   CREATININE 0.9 03/04/2017 0808   CREATININE 0.9 06/25/2016 1159      Component Value Date/Time   CALCIUM 9.6 12/13/2022 1006   CALCIUM 9.4 03/04/2017 0808   CALCIUM 9.7 06/25/2016 1159   ALKPHOS 104 12/13/2022 1006   ALKPHOS 86 (H) 03/04/2017 0808   ALKPHOS 96 06/25/2016 1159   AST 18 12/13/2022 1006   AST 21 06/25/2016 1159   ALT 15 12/13/2022 1006   ALT 20 03/04/2017 0808   ALT 16 06/25/2016 1159   BILITOT 0.6 12/13/2022 1006   BILITOT 0.44 06/25/2016 1159      Impression and Plan: Cassandra Moses is a very pleasant 67 year old Caucasian female with polycythemia vera.  The blood disorder is JAK2 positive.     Chronic and stable. Reviewed her labs from today with patient. No dose adjustment is needed in her medication. No phlebotomy needed today as her HCT is within target (35.9)  We will plan to get her back in 12 months per patient request. This one month extension is reasonable given her stable labs. Reviewed red flags that would indicate she should be seen sooner.   Rushie Chestnut, PA-C 8/1/202411:13 AM

## 2023-09-12 ENCOUNTER — Encounter: Payer: Self-pay | Admitting: Hematology & Oncology

## 2023-09-18 ENCOUNTER — Other Ambulatory Visit: Payer: Self-pay | Admitting: Medical Oncology

## 2023-09-18 DIAGNOSIS — D45 Polycythemia vera: Secondary | ICD-10-CM

## 2023-09-19 ENCOUNTER — Inpatient Hospital Stay (HOSPITAL_BASED_OUTPATIENT_CLINIC_OR_DEPARTMENT_OTHER): Admitting: Medical Oncology

## 2023-09-19 ENCOUNTER — Inpatient Hospital Stay: Attending: Hematology & Oncology

## 2023-09-19 ENCOUNTER — Encounter: Payer: Self-pay | Admitting: Medical Oncology

## 2023-09-19 VITALS — BP 153/91 | HR 91 | Temp 97.7°F | Resp 18 | Ht <= 58 in | Wt 100.1 lb

## 2023-09-19 DIAGNOSIS — R110 Nausea: Secondary | ICD-10-CM | POA: Diagnosis not present

## 2023-09-19 DIAGNOSIS — R5383 Other fatigue: Secondary | ICD-10-CM | POA: Insufficient documentation

## 2023-09-19 DIAGNOSIS — D45 Polycythemia vera: Secondary | ICD-10-CM | POA: Insufficient documentation

## 2023-09-19 DIAGNOSIS — D649 Anemia, unspecified: Secondary | ICD-10-CM | POA: Diagnosis not present

## 2023-09-19 DIAGNOSIS — I3139 Other pericardial effusion (noninflammatory): Secondary | ICD-10-CM | POA: Insufficient documentation

## 2023-09-19 DIAGNOSIS — Z7982 Long term (current) use of aspirin: Secondary | ICD-10-CM | POA: Insufficient documentation

## 2023-09-19 DIAGNOSIS — R61 Generalized hyperhidrosis: Secondary | ICD-10-CM | POA: Insufficient documentation

## 2023-09-19 DIAGNOSIS — R1012 Left upper quadrant pain: Secondary | ICD-10-CM | POA: Diagnosis not present

## 2023-09-19 DIAGNOSIS — R059 Cough, unspecified: Secondary | ICD-10-CM | POA: Diagnosis not present

## 2023-09-19 DIAGNOSIS — Z79899 Other long term (current) drug therapy: Secondary | ICD-10-CM | POA: Diagnosis not present

## 2023-09-19 DIAGNOSIS — N200 Calculus of kidney: Secondary | ICD-10-CM | POA: Insufficient documentation

## 2023-09-19 DIAGNOSIS — R634 Abnormal weight loss: Secondary | ICD-10-CM | POA: Insufficient documentation

## 2023-09-19 DIAGNOSIS — R0789 Other chest pain: Secondary | ICD-10-CM

## 2023-09-19 LAB — CMP (CANCER CENTER ONLY)
ALT: 11 U/L (ref 0–44)
AST: 16 U/L (ref 15–41)
Albumin: 4.8 g/dL (ref 3.5–5.0)
Alkaline Phosphatase: 99 U/L (ref 38–126)
Anion gap: 9 (ref 5–15)
BUN: 11 mg/dL (ref 8–23)
CO2: 26 mmol/L (ref 22–32)
Calcium: 9.7 mg/dL (ref 8.9–10.3)
Chloride: 98 mmol/L (ref 98–111)
Creatinine: 0.79 mg/dL (ref 0.44–1.00)
GFR, Estimated: >60 mL/min (ref >60)
Glucose, Bld: 112 mg/dL — ABNORMAL HIGH (ref 70–99)
Potassium: 4.2 mmol/L (ref 3.5–5.1)
Sodium: 133 mmol/L — ABNORMAL LOW (ref 135–145)
Total Bilirubin: 0.6 mg/dL (ref 0.0–1.2)
Total Protein: 7.5 g/dL (ref 6.5–8.1)

## 2023-09-19 LAB — LACTATE DEHYDROGENASE: LDH: 240 U/L — ABNORMAL HIGH (ref 98–192)

## 2023-09-19 LAB — IRON AND IRON BINDING CAPACITY (CC-WL,HP ONLY)
Iron: 152 ug/dL (ref 28–170)
Saturation Ratios: 45 % — ABNORMAL HIGH (ref 10.4–31.8)
TIBC: 335 ug/dL (ref 250–450)
UIBC: 183 ug/dL (ref 148–442)

## 2023-09-19 LAB — CBC
HCT: 32.6 % — ABNORMAL LOW (ref 36.0–46.0)
Hemoglobin: 11.8 g/dL — ABNORMAL LOW (ref 12.0–15.0)
MCH: 51.8 pg — ABNORMAL HIGH (ref 26.0–34.0)
MCHC: 36.2 g/dL — ABNORMAL HIGH (ref 30.0–36.0)
MCV: 143 fL — ABNORMAL HIGH (ref 80.0–100.0)
Platelets: 128 10*3/uL — ABNORMAL LOW (ref 150–400)
RBC: 2.28 MIL/uL — ABNORMAL LOW (ref 3.87–5.11)
RDW: 12.7 % (ref 11.5–15.5)
WBC: 4.7 10*3/uL (ref 4.0–10.5)
nRBC: 0 % (ref 0.0–0.2)

## 2023-09-19 LAB — FERRITIN: Ferritin: 153 ng/mL (ref 11–307)

## 2023-09-19 NOTE — Progress Notes (Signed)
 Hematology and Oncology Follow Up Visit  Cassandra Moses [id number] 1955-08-31 68 y.o. 09/19/2023   Principle Diagnosis:  Polycythemia vera-JAK2 positive  Current Therapy:   Hydrea   1000mg  po q day - changed on 12/03/2018 to 500 mg daily  Aspirin 81 mg by mouth daily Phlebotomy to maintain hematocrit below 45%   Interim History:  Cassandra Moses is here today for follow-up.  Her last visit with our office was on 03/15/2022.  She is here with her husband. They voice concern of pain of her left rib cage/abdomen which has been present for about 4 months. She also has a dry cough, nausea, fatigue, unintentional weight loss and night sweats.  Laying down makes it better. She denies rash of injury. No SOB, chest pain  No headaches, bleeding, blood clots. She continues to take her hydrea  and asa. Hydrea  is tolerated fairly well with mild fatigue.   She has had no problems with fever.   Has been no change in bowel or bladder habits.  She has had no rashes.  She has had no pain.  Is been no hot flashes or sweats.  Overall, I would say her performance status is probably ECOG 0.  Wt Readings from Last 3 Encounters:  09/19/23 100 lb 1.9 oz (45.4 kg)  12/13/22 103 lb (46.7 kg)  03/15/22 108 lb (49 kg)     Medications:  Allergies as of 09/19/2023   No Known Allergies      Medication List        Accurate as of Sep 19, 2023 12:34 PM. If you have any questions, ask your nurse or doctor.          aspirin 81 MG chewable tablet Chew 81 mg by mouth daily.   hydroxyurea  500 MG capsule Commonly known as: HYDREA  TAKE 2 CAPSULES BY MOUTH DAILY   Naproxen Sodium 220 MG Caps Take 440 mg by mouth daily.   telmisartan  40 MG tablet Commonly known as: MICARDIS  TAKE ONE TABLET BY MOUTH DAILY        Allergies: No Known Allergies  Past Medical History, Surgical history, Social history, and Family History were reviewed and updated.  Review of Systems: Review of Systems   Constitutional: Negative.   HENT: Negative.    Eyes: Negative.   Respiratory: Negative.    Cardiovascular: Negative.   Genitourinary: Negative.   Musculoskeletal: Negative.   Skin: Negative.   Neurological: Negative.   Endo/Heme/Allergies: Negative.   Psychiatric/Behavioral: Negative.       Physical Exam:  height is 4' 9.87" (1.47 m) and weight is 100 lb 1.9 oz (45.4 kg). Her oral temperature is 97.7 F (36.5 C). Her blood pressure is 153/91 (abnormal) and her pulse is 91. Her respiration is 18 and oxygen saturation is 100%.   Wt Readings from Last 3 Encounters:  09/19/23 100 lb 1.9 oz (45.4 kg)  12/13/22 103 lb (46.7 kg)  03/15/22 108 lb (49 kg)    Physical Exam Vitals reviewed.  Constitutional:      Comments: thin  HENT:     Head: Normocephalic and atraumatic.  Eyes:     Pupils: Pupils are equal, round, and reactive to light.  Cardiovascular:     Rate and Rhythm: Normal rate and regular rhythm.     Heart sounds: Normal heart sounds.  Pulmonary:     Effort: Pulmonary effort is normal.     Breath sounds: Normal breath sounds.  Abdominal:     General: Bowel sounds are normal.  Palpations: Abdomen is soft.     Comments: The lower anterior left ribcage is more prominent than the right side. No tenderness or palpable spleen. No palpable mass  Musculoskeletal:        General: No tenderness or deformity. Normal range of motion.     Cervical back: Normal range of motion.  Lymphadenopathy:     Cervical: No cervical adenopathy.  Skin:    General: Skin is warm and dry.     Coloration: Skin is not jaundiced or pale.     Findings: No bruising, erythema, lesion or rash.  Neurological:     Mental Status: She is alert and oriented to person, place, and time.  Psychiatric:        Behavior: Behavior normal.        Thought Content: Thought content normal.        Judgment: Judgment normal.     Lab Results  Component Value Date   WBC 4.7 09/19/2023   HGB 11.8 (L)  09/19/2023   HCT 32.6 (L) 09/19/2023   MCV 143.0 (H) 09/19/2023   PLT 128 (L) 09/19/2023   Lab Results  Component Value Date   FERRITIN 70 12/13/2022   IRON 182 (H) 12/13/2022   TIBC 315 12/13/2022   UIBC 133 (L) 12/13/2022   IRONPCTSAT 58 (H) 12/13/2022   Lab Results  Component Value Date   RBC 2.28 (L) 09/19/2023   No results found for: "KPAFRELGTCHN", "LAMBDASER", "KAPLAMBRATIO" No results found for: "IGGSERUM", "IGA", "IGMSERUM" No results found for: "TOTALPROTELP", "ALBUMINELP", "A1GS", "A2GS", "BETS", "BETA2SER", "GAMS", "MSPIKE", "SPEI"   Chemistry      Component Value Date/Time   NA 133 (L) 09/19/2023 1115   NA 143 03/04/2017 0808   NA 138 06/25/2016 1159   K 4.2 09/19/2023 1115   K 4.5 03/04/2017 0808   K 3.9 06/25/2016 1159   CL 98 09/19/2023 1115   CL 102 03/04/2017 0808   CO2 26 09/19/2023 1115   CO2 29 03/04/2017 0808   CO2 23 06/25/2016 1159   BUN 11 09/19/2023 1115   BUN 14 03/04/2017 0808   BUN 10.3 06/25/2016 1159   CREATININE 0.79 09/19/2023 1115   CREATININE 0.9 03/04/2017 0808   CREATININE 0.9 06/25/2016 1159      Component Value Date/Time   CALCIUM 9.7 09/19/2023 1115   CALCIUM 9.4 03/04/2017 0808   CALCIUM 9.7 06/25/2016 1159   ALKPHOS 99 09/19/2023 1115   ALKPHOS 86 (H) 03/04/2017 0808   ALKPHOS 96 06/25/2016 1159   AST 16 09/19/2023 1115   AST 21 06/25/2016 1159   ALT 11 09/19/2023 1115   ALT 20 03/04/2017 0808   ALT 16 06/25/2016 1159   BILITOT 0.6 09/19/2023 1115   BILITOT 0.44 06/25/2016 1159     Encounter Diagnoses  Name Primary?   Unintentional weight loss Yes   Night sweats    Other fatigue    Chest discomfort    Left upper quadrant abdominal pain    Polycythemia vera (HCC)     Impression and Plan: Cassandra Moses is a very pleasant 68 year old Caucasian female with polycythemia vera.  The blood disorder is JAK2 positive.     CBC shows new anemia with a Hgb of 11.8. MCV has significantly elevated to 143.0 and platelets  are now 128 from 414.  History of present illness is concerning and needs additional work up including labs and imaging. I have discussed this with patient who is agreeable.   Labs pending STAT CT Chest/Abd/Pelvis  w/ contrast pending RTC 2 weeks APP, labs (CBC w/, retic, smear, B12, LDH)  Cassandra Arthurs Kempner, PA-C 5/8/202512:34 PM

## 2023-09-20 ENCOUNTER — Ambulatory Visit (HOSPITAL_BASED_OUTPATIENT_CLINIC_OR_DEPARTMENT_OTHER)
Admission: RE | Admit: 2023-09-20 | Discharge: 2023-09-20 | Disposition: A | Discharge status: Home / Self Care | Source: Ambulatory Visit | Attending: Medical Oncology | Admitting: Medical Oncology

## 2023-09-20 DIAGNOSIS — D45 Polycythemia vera: Secondary | ICD-10-CM | POA: Insufficient documentation

## 2023-09-20 DIAGNOSIS — R5383 Other fatigue: Secondary | ICD-10-CM | POA: Diagnosis present

## 2023-09-20 DIAGNOSIS — R1012 Left upper quadrant pain: Secondary | ICD-10-CM | POA: Insufficient documentation

## 2023-09-20 DIAGNOSIS — R0789 Other chest pain: Secondary | ICD-10-CM | POA: Diagnosis present

## 2023-09-20 DIAGNOSIS — R61 Generalized hyperhidrosis: Secondary | ICD-10-CM | POA: Insufficient documentation

## 2023-09-20 DIAGNOSIS — R634 Abnormal weight loss: Secondary | ICD-10-CM | POA: Insufficient documentation

## 2023-09-20 MED ORDER — IOHEXOL 300 MG/ML  SOLN
75.0000 mL | Freq: Once | INTRAMUSCULAR | Status: AC | PRN
  Administered 2023-09-20: 75 mL via INTRAVENOUS

## 2023-09-23 ENCOUNTER — Encounter: Payer: Self-pay | Admitting: Medical Oncology

## 2023-09-23 ENCOUNTER — Other Ambulatory Visit: Payer: Self-pay | Admitting: Medical Oncology

## 2023-09-23 DIAGNOSIS — R935 Abnormal findings on diagnostic imaging of other abdominal regions, including retroperitoneum: Secondary | ICD-10-CM

## 2023-09-23 DIAGNOSIS — I771 Stricture of artery: Secondary | ICD-10-CM

## 2023-10-03 ENCOUNTER — Encounter: Payer: Self-pay | Admitting: Medical Oncology

## 2023-10-03 ENCOUNTER — Inpatient Hospital Stay (HOSPITAL_BASED_OUTPATIENT_CLINIC_OR_DEPARTMENT_OTHER): Admitting: Medical Oncology

## 2023-10-03 ENCOUNTER — Inpatient Hospital Stay

## 2023-10-03 VITALS — BP 147/90 | HR 96 | Temp 97.5°F | Resp 18 | Ht <= 58 in | Wt 108.4 lb

## 2023-10-03 DIAGNOSIS — R5383 Other fatigue: Secondary | ICD-10-CM

## 2023-10-03 DIAGNOSIS — R61 Generalized hyperhidrosis: Secondary | ICD-10-CM

## 2023-10-03 DIAGNOSIS — R634 Abnormal weight loss: Secondary | ICD-10-CM

## 2023-10-03 DIAGNOSIS — D45 Polycythemia vera: Secondary | ICD-10-CM

## 2023-10-03 DIAGNOSIS — R0789 Other chest pain: Secondary | ICD-10-CM | POA: Diagnosis not present

## 2023-10-03 DIAGNOSIS — D539 Nutritional anemia, unspecified: Secondary | ICD-10-CM

## 2023-10-03 DIAGNOSIS — R1012 Left upper quadrant pain: Secondary | ICD-10-CM

## 2023-10-03 DIAGNOSIS — D649 Anemia, unspecified: Secondary | ICD-10-CM

## 2023-10-03 LAB — CBC WITH DIFFERENTIAL (CANCER CENTER ONLY)
Abs Immature Granulocytes: 0.01 10*3/uL (ref 0.00–0.07)
Basophils Absolute: 0 10*3/uL (ref 0.0–0.1)
Basophils Relative: 0 %
Eosinophils Absolute: 0 10*3/uL (ref 0.0–0.5)
Eosinophils Relative: 1 %
HCT: 30.9 % — ABNORMAL LOW (ref 36.0–46.0)
Hemoglobin: 11.2 g/dL — ABNORMAL LOW (ref 12.0–15.0)
Immature Granulocytes: 0 %
Lymphocytes Relative: 40 %
Lymphs Abs: 1.5 10*3/uL (ref 0.7–4.0)
MCH: 52.6 pg — ABNORMAL HIGH (ref 26.0–34.0)
MCHC: 36.2 g/dL — ABNORMAL HIGH (ref 30.0–36.0)
MCV: 145.1 fL — ABNORMAL HIGH (ref 80.0–100.0)
Monocytes Absolute: 0.5 10*3/uL (ref 0.1–1.0)
Monocytes Relative: 12 %
Neutro Abs: 1.7 10*3/uL (ref 1.7–7.7)
Neutrophils Relative %: 47 %
Platelet Count: 425 10*3/uL — ABNORMAL HIGH (ref 150–400)
RBC: 2.13 MIL/uL — ABNORMAL LOW (ref 3.87–5.11)
RDW: 12 % (ref 11.5–15.5)
Smear Review: NORMAL
WBC Count: 3.7 10*3/uL — ABNORMAL LOW (ref 4.0–10.5)
WBC Morphology: ABNORMAL
nRBC: 0 % (ref 0.0–0.2)

## 2023-10-03 LAB — RETIC PANEL
Immature Retic Fract: 16.6 % — ABNORMAL HIGH (ref 2.3–15.9)
RBC.: 2.14 MIL/uL — ABNORMAL LOW (ref 3.87–5.11)
Retic Count, Absolute: 29.5 10*3/uL (ref 19.0–186.0)
Retic Ct Pct: 1.4 % (ref 0.4–3.1)
Reticulocyte Hemoglobin: 48 pg (ref >27.9)

## 2023-10-03 LAB — LACTATE DEHYDROGENASE: LDH: 229 U/L — ABNORMAL HIGH (ref 98–192)

## 2023-10-03 LAB — VITAMIN B12: Vitamin B-12: 156 pg/mL — ABNORMAL LOW (ref 180–914)

## 2023-10-03 LAB — SAVE SMEAR(SSMR), FOR PROVIDER SLIDE REVIEW

## 2023-10-03 NOTE — Progress Notes (Signed)
 Hematology and Oncology Follow Up Visit  DULA HAVLIK [id number] 01/11/1956 68 y.o. 10/03/2023   Principle Diagnosis:  Polycythemia vera-JAK2 positive  Current Therapy:   Hydrea   1000mg  po q day - changed on 12/03/2018 to 500 mg daily  Aspirin 81 mg by mouth daily Phlebotomy to maintain hematocrit below 45%   Interim History:  Ms. Sida is here today for follow-up.    At her last visit her and her husband voiced concerns of pain of her left rib cage/abdomen which has been present for about 4 months. She also had a dry cough, nausea, fatigue, unintentional weight loss and night sweats.   A CT Chest/ABD/pelvis was ordered- results shown below.   IMPRESSION: No developing mass lesion, fluid collection or lymph node enlargement. No splenomegaly.   Dilatation of the thoracic aorta both ascending and descending. Largest diameter is along the descending thoracic aorta of 5 cm in diameter. Recommend surgical consultation and follow-up CT or MR as appropriate in 6 months and referral to or continued care with vascular specialist. (Ref.: J Vasc Surg. 2018; 67:2-77 and J Am Coll Radiol 2013;10(10):789-794.)   Small pericardial effusion.   No bowel obstruction.  No free air or free fluid.   Nonobstructing right-sided renal stones.     Electronically Signed   By: Adrianna Horde M.D.   On: 09/20/2023 16:00  Today she reports that she has been about the same as when I saw her her last.   She does have an appointment in July with Vascular. No chest pains or back pains.   No headaches, bleeding, blood clots. She continues to take her hydrea  and asa. Hydrea  is tolerated fairly well with mild fatigue.   She has had no problems with fever.   Has been no change in bowel or bladder habits.  She has had no rashes.  She has had no pain.  Is been no hot flashes or sweats.  Overall, I would say her performance status is probably ECOG 0.  Wt Readings from Last 3 Encounters:  10/03/23  108 lb 6.4 oz (49.2 kg)  09/19/23 100 lb 1.9 oz (45.4 kg)  12/13/22 103 lb (46.7 kg)     Medications:  Allergies as of 10/03/2023   No Known Allergies      Medication List        Accurate as of Oct 03, 2023 10:54 AM. If you have any questions, ask your nurse or doctor.          aspirin 81 MG chewable tablet Chew 81 mg by mouth daily.   hydroxyurea  500 MG capsule Commonly known as: HYDREA  TAKE 2 CAPSULES BY MOUTH DAILY   Naproxen Sodium 220 MG Caps Take 440 mg by mouth daily.   telmisartan  40 MG tablet Commonly known as: MICARDIS  TAKE ONE TABLET BY MOUTH DAILY        Allergies: No Known Allergies  Past Medical History, Surgical history, Social history, and Family History were reviewed and updated.  Review of Systems: Review of Systems  Constitutional: Negative.   HENT: Negative.    Eyes: Negative.   Respiratory: Negative.    Cardiovascular: Negative.   Genitourinary: Negative.   Musculoskeletal: Negative.   Skin: Negative.   Neurological: Negative.   Endo/Heme/Allergies: Negative.   Psychiatric/Behavioral: Negative.       Physical Exam:  height is 4\' 9"  (1.448 m) and weight is 108 lb 6.4 oz (49.2 kg). Her oral temperature is 97.5 F (36.4 C) (abnormal). Her blood pressure is  147/90 (abnormal) and her pulse is 96. Her respiration is 18 and oxygen saturation is 100%.   Wt Readings from Last 3 Encounters:  10/03/23 108 lb 6.4 oz (49.2 kg)  09/19/23 100 lb 1.9 oz (45.4 kg)  12/13/22 103 lb (46.7 kg)    Physical Exam Vitals reviewed.  Constitutional:      Comments: Thin Declines formal examination   HENT:     Head: Normocephalic and atraumatic.  Eyes:     Pupils: Pupils are equal, round, and reactive to light.  Neurological:     Mental Status: She is alert and oriented to person, place, and time.  Psychiatric:        Behavior: Behavior normal.        Thought Content: Thought content normal.        Judgment: Judgment normal.     Lab  Results  Component Value Date   WBC 3.7 (L) 10/03/2023   HGB 11.2 (L) 10/03/2023   HCT 30.9 (L) 10/03/2023   MCV 145.1 (H) 10/03/2023   PLT 425 (H) 10/03/2023   Lab Results  Component Value Date   FERRITIN 153 09/19/2023   IRON 152 09/19/2023   TIBC 335 09/19/2023   UIBC 183 09/19/2023   IRONPCTSAT 45 (H) 09/19/2023   Lab Results  Component Value Date   RBC 2.13 (L) 10/03/2023   No results found for: "KPAFRELGTCHN", "LAMBDASER", "KAPLAMBRATIO" No results found for: "IGGSERUM", "IGA", "IGMSERUM" No results found for: "TOTALPROTELP", "ALBUMINELP", "A1GS", "A2GS", "BETS", "BETA2SER", "GAMS", "MSPIKE", "SPEI"   Chemistry      Component Value Date/Time   NA 133 (L) 09/19/2023 1115   NA 143 03/04/2017 0808   NA 138 06/25/2016 1159   K 4.2 09/19/2023 1115   K 4.5 03/04/2017 0808   K 3.9 06/25/2016 1159   CL 98 09/19/2023 1115   CL 102 03/04/2017 0808   CO2 26 09/19/2023 1115   CO2 29 03/04/2017 0808   CO2 23 06/25/2016 1159   BUN 11 09/19/2023 1115   BUN 14 03/04/2017 0808   BUN 10.3 06/25/2016 1159   CREATININE 0.79 09/19/2023 1115   CREATININE 0.9 03/04/2017 0808   CREATININE 0.9 06/25/2016 1159      Component Value Date/Time   CALCIUM 9.7 09/19/2023 1115   CALCIUM 9.4 03/04/2017 0808   CALCIUM 9.7 06/25/2016 1159   ALKPHOS 99 09/19/2023 1115   ALKPHOS 86 (H) 03/04/2017 0808   ALKPHOS 96 06/25/2016 1159   AST 16 09/19/2023 1115   AST 21 06/25/2016 1159   ALT 11 09/19/2023 1115   ALT 20 03/04/2017 0808   ALT 16 06/25/2016 1159   BILITOT 0.6 09/19/2023 1115   BILITOT 0.44 06/25/2016 1159     Encounter Diagnoses  Name Primary?   Unintentional weight loss Yes   Night sweats    Other fatigue    Chest discomfort    Polycythemia vera (HCC)     Impression and Plan: Ms. Homewood is a very pleasant 68 year old Caucasian female with polycythemia vera.  The blood disorder is JAK2 positive.  She is on Hydrea .   After review of her CBC changes today I have  recommended a BMB which she declines.  Blood film today read by Dr. Maria Shiner shows some immature WBC. Question if bone marrow is transforming into include myelofibrotic changes causing symptoms and CBC abnormalities.  We discussed the benefits of a bone marrow biopsy for clarification however she declines.  Likely B12 deficient and will start her on this  RTC  1 month MD, labs (in system) Sharla Davis, PA-C 5/22/202510:54 AM

## 2023-10-08 ENCOUNTER — Other Ambulatory Visit: Payer: Self-pay | Admitting: Medical Oncology

## 2023-10-08 ENCOUNTER — Ambulatory Visit: Payer: Self-pay | Admitting: Medical Oncology

## 2023-10-28 NOTE — Progress Notes (Signed)
 [facility] [place]  ASSESSMENT / PLAN: Cassandra Moses is a 68 y.o. 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  Recommend:  Abstinence from all tobacco products. Blood glucose control with goal A1c < 7%. Blood pressure control with goal blood pressure < 130/80 mmHg. Lipid reduction therapy with goal LDL-C < 55 mg/dL. Aspirin 81mg  by mouth daily. Atorvastatin 40-80mg  PO QD (or other high intensity statin therapy).  Recommend repair at 55mm.  Follow-up in 1 year with CT angiogram of the chest, abdomen, and pelvis.  CHIEF COMPLAINT: Incidental thoracoabdominal aneurysm  HISTORY OF PRESENT ILLNESS: Cassandra Moses is a 69 y.o. female referred to clinic for incidental discovery of a 50 mm thoracoabdominal aortic aneurysm.  This was discovered during workup for left-sided chest wall pain.  The patient reports the chest wall pain is under the left breast just above the left abdomen.  The pain improves with compression.  The pain worsens with coughing.  The pain has persisted for several months.  It does not seem typical of symptomatic aneurysm.  Past Medical History:  Diagnosis Date   Polycythemia vera (HCC) 07/20/2015    History reviewed. No pertinent surgical history.  History reviewed. No pertinent family history.  Social History   Socioeconomic History   Marital status: Divorced    Spouse name: Not on file   Number of children: Not on file   Years of education: Not on file   Highest education level: Not on file  Occupational History   Not on file  Tobacco Use   Smoking status: Every Day    Current packs/day: 1.00    Types: Cigarettes   Smokeless tobacco: Never   Tobacco comments:    1/2 PPD  Vaping Use   Vaping status: Never Used  Substance and Sexual Activity   Alcohol use: Yes    Alcohol/week: 0.0 standard drinks of alcohol    Comment: GLASS OF WINE PER DAY   Drug use: No   Sexual activity: Not  Currently    Comment: 17 YEARS , DOESNT REMEMBER  Other Topics Concern   Not on file  Social History Narrative   Not on file   Social Drivers of Health   Financial Resource Strain: Not on file  Food Insecurity: Not on file  Transportation Needs: Not on file  Physical Activity: Not on file  Stress: Not on file  Social Connections: Not on file  Intimate Partner Violence: Not on file    No Known Allergies  Current Outpatient Medications  Medication Sig Dispense Refill   aspirin 81 MG chewable tablet Chew 81 mg by mouth daily.     hydroxyurea  (HYDREA ) 500 MG capsule TAKE 2 CAPSULES BY MOUTH DAILY 120 capsule 6   Naproxen Sodium 220 MG CAPS Take 440 mg by mouth daily.     telmisartan  (MICARDIS ) 40 MG tablet TAKE ONE TABLET BY MOUTH DAILY 90 tablet 6   No current facility-administered medications for this visit.    PHYSICAL EXAM Vitals:   10/29/23 1302  BP: (!) 152/95  Pulse: (!) 103  Temp: 98 F (36.7 C)  SpO2: 95%  Weight: 98 lb (44.5 kg)  Height: 4' 9 (1.448 m)    Well-appearing elderly woman in no distress.  Petite. Regular rate and rhythm Unlabored breathing No chest wall tenderness  PERTINENT LABORATORY AND RADIOLOGIC DATA  Most recent CBC    Latest Ref Rng & Units 10/03/2023   10:22 AM 09/19/2023   11:15  AM 12/13/2022   10:06 AM  CBC  WBC 4.0 - 10.5 K/uL 3.7  4.7  4.5   Hemoglobin 12.0 - 15.0 g/dL 40.9  81.1  91.4   Hematocrit 36.0 - 46.0 % 30.9  32.6  35.9   Platelets 150 - 400 K/uL 425  128  414      Most recent CMP    Latest Ref Rng & Units 09/19/2023   11:15 AM 12/13/2022   10:06 AM 03/15/2022   11:14 AM  CMP  Glucose 70 - 99 mg/dL 782  956  213   BUN 8 - 23 mg/dL 11  15  14    Creatinine 0.44 - 1.00 mg/dL 0.86  5.78  4.69   Sodium 135 - 145 mmol/L 133  139  134   Potassium 3.5 - 5.1 mmol/L 4.2  4.4  5.1   Chloride 98 - 111 mmol/L 98  103  98   CO2 22 - 32 mmol/L 26  26  27    Calcium 8.9 - 10.3 mg/dL 9.7  9.6  62.9   Total Protein 6.5 - 8.1 g/dL  7.5  7.5  7.8   Total Bilirubin 0.0 - 1.2 mg/dL 0.6  0.6  0.6   Alkaline Phos 38 - 126 U/L 99  104  103   AST 15 - 41 U/L 16  18  16    ALT 0 - 44 U/L 11  15  10      Renal function CrCl cannot be calculated (Patient's most recent lab result is older than the maximum 21 days allowed.).  No results found for: HGBA1C  LDL Cholesterol  Date Value Ref Range Status  06/23/2015 144 (H) <130 mg/dL Final    Comment:      Total Cholesterol/HDL Ratio:CHD Risk                        Coronary Heart Disease Risk Table                                        Men       Women          1/2 Average Risk              3.4        3.3              Average Risk              5.0        4.4           2X Average Risk              9.6        7.1           3X Average Risk             23.4       11.0 Use the calculated Patient Ratio above and the CHD Risk table  to determine the patient's CHD Risk.     CT scan of chest, abdomen, and pelvis.  09/20/2023.  Personally reviewed.  50 mm thoracoabdominal aneurysm extending from the origin of the left subclavian artery to the perivisceral segment.  No evidence of rupture.  No other findings on CT scan to explain left sided chest wall pain  Heber Little. Edgardo Goodwill, MD FACS [facility] [employer] Number: [phone] 10/29/2023 3:56 PM   Total time spent on preparing this encounter including chart review, data review, collecting history, examining the patient, and coordinating care: 60 minutes  Portions of this report may have been transcribed using voice recognition software.  Every effort has been made to ensure accuracy; however, inadvertent computerized transcription errors may still be present.

## 2023-10-29 ENCOUNTER — Ambulatory Visit: Attending: Vascular Surgery | Admitting: Vascular Surgery

## 2023-10-29 ENCOUNTER — Encounter: Payer: Self-pay | Admitting: Vascular Surgery

## 2023-10-29 VITALS — BP 152/95 | HR 103 | Temp 98.0°F | Ht <= 58 in | Wt 98.0 lb

## 2023-10-29 DIAGNOSIS — I7161 Supraceliac aneurysm of the thoracoabdominal aorta, without rupture: Secondary | ICD-10-CM

## 2023-11-04 ENCOUNTER — Inpatient Hospital Stay: Attending: Hematology & Oncology

## 2023-11-04 ENCOUNTER — Encounter: Payer: Self-pay | Admitting: Hematology & Oncology

## 2023-11-04 ENCOUNTER — Inpatient Hospital Stay (HOSPITAL_BASED_OUTPATIENT_CLINIC_OR_DEPARTMENT_OTHER): Admitting: Hematology & Oncology

## 2023-11-04 VITALS — BP 162/97 | HR 102 | Temp 98.1°F | Resp 20 | Ht <= 58 in | Wt 99.0 lb

## 2023-11-04 DIAGNOSIS — Z7964 Long term (current) use of myelosuppressive agent: Secondary | ICD-10-CM | POA: Diagnosis not present

## 2023-11-04 DIAGNOSIS — I712 Thoracic aortic aneurysm, without rupture, unspecified: Secondary | ICD-10-CM | POA: Insufficient documentation

## 2023-11-04 DIAGNOSIS — D45 Polycythemia vera: Secondary | ICD-10-CM | POA: Insufficient documentation

## 2023-11-04 DIAGNOSIS — Z8679 Personal history of other diseases of the circulatory system: Secondary | ICD-10-CM | POA: Insufficient documentation

## 2023-11-04 DIAGNOSIS — E538 Deficiency of other specified B group vitamins: Secondary | ICD-10-CM | POA: Diagnosis not present

## 2023-11-04 DIAGNOSIS — I714 Abdominal aortic aneurysm, without rupture, unspecified: Secondary | ICD-10-CM | POA: Diagnosis not present

## 2023-11-04 DIAGNOSIS — Z7982 Long term (current) use of aspirin: Secondary | ICD-10-CM | POA: Insufficient documentation

## 2023-11-04 DIAGNOSIS — D539 Nutritional anemia, unspecified: Secondary | ICD-10-CM

## 2023-11-04 LAB — CBC WITH DIFFERENTIAL (CANCER CENTER ONLY)
Abs Immature Granulocytes: 0.02 10*3/uL (ref 0.00–0.07)
Basophils Absolute: 0 10*3/uL (ref 0.0–0.1)
Basophils Relative: 0 %
Eosinophils Absolute: 0 10*3/uL (ref 0.0–0.5)
Eosinophils Relative: 1 %
HCT: 34 % — ABNORMAL LOW (ref 36.0–46.0)
Hemoglobin: 12 g/dL (ref 12.0–15.0)
Immature Granulocytes: 1 %
Lymphocytes Relative: 37 %
Lymphs Abs: 1.6 10*3/uL (ref 0.7–4.0)
MCH: 50.4 pg — ABNORMAL HIGH (ref 26.0–34.0)
MCHC: 35.3 g/dL (ref 30.0–36.0)
MCV: 142.9 fL — ABNORMAL HIGH (ref 80.0–100.0)
Monocytes Absolute: 0.6 10*3/uL (ref 0.1–1.0)
Monocytes Relative: 14 %
Neutro Abs: 2 10*3/uL (ref 1.7–7.7)
Neutrophils Relative %: 47 %
Platelet Count: 342 10*3/uL (ref 150–400)
RBC: 2.38 MIL/uL — ABNORMAL LOW (ref 3.87–5.11)
RDW: 11.5 % (ref 11.5–15.5)
WBC Count: 4.2 10*3/uL (ref 4.0–10.5)
nRBC: 0 % (ref 0.0–0.2)

## 2023-11-04 LAB — VITAMIN B12: Vitamin B-12: 686 pg/mL (ref 180–914)

## 2023-11-04 LAB — CMP (CANCER CENTER ONLY)
ALT: 13 U/L (ref 0–44)
AST: 12 U/L — ABNORMAL LOW (ref 15–41)
Albumin: 4.6 g/dL (ref 3.5–5.0)
Alkaline Phosphatase: 96 U/L (ref 38–126)
Anion gap: 8 (ref 5–15)
BUN: 15 mg/dL (ref 8–23)
CO2: 28 mmol/L (ref 22–32)
Calcium: 9.8 mg/dL (ref 8.9–10.3)
Chloride: 99 mmol/L (ref 98–111)
Creatinine: 0.8 mg/dL (ref 0.44–1.00)
GFR, Estimated: >60 mL/min (ref >60)
Glucose, Bld: 108 mg/dL — ABNORMAL HIGH (ref 70–99)
Potassium: 5 mmol/L (ref 3.5–5.1)
Sodium: 135 mmol/L (ref 135–145)
Total Bilirubin: 0.4 mg/dL (ref 0.0–1.2)
Total Protein: 7.1 g/dL (ref 6.5–8.1)

## 2023-11-04 LAB — RETIC PANEL
Immature Retic Fract: 25.3 % — ABNORMAL HIGH (ref 2.3–15.9)
RBC.: 2.38 MIL/uL — ABNORMAL LOW (ref 3.87–5.11)
Retic Count, Absolute: 33.6 10*3/uL (ref 19.0–186.0)
Retic Ct Pct: 1.4 % (ref 0.4–3.1)
Reticulocyte Hemoglobin: 46.1 pg (ref >27.9)

## 2023-11-04 LAB — LACTATE DEHYDROGENASE: LDH: 223 U/L — ABNORMAL HIGH (ref 98–192)

## 2023-11-04 NOTE — Progress Notes (Signed)
 Hematology and Oncology Follow Up Visit  [name] [id number] 1955-09-14 68 y.o. 11/04/2023   Principle Diagnosis:  Polycythemia vera-JAK2 positive  Current Therapy:   Hydrea   1000mg  po q day  Aspirin 81 mg by mouth daily Phlebotomy to maintain hematocrit below 45%   Interim History:  Cassandra Moses is here today for follow-up.  She seems to doing pretty well right now.  There was little concern that she may have been transformed the polycythemia.  She declined a bone marrow biopsy.  She did have a abdominal aortic aneurysm I think.  Either that or the lower thoracic aortic aneurysm.  I think she is being seen by Vascular Surgery..  She continues on the Hydrea .  She is doing well on the Hydrea .  She has had no problems with nausea or vomiting.  She has had no change in bowel or bladder habits.  She has had no cough or shortness of breath.  There has been no bleeding.  She has had no rashes.  Currently, I would have said that her performance status is ECOG 1.    Wt Readings from Last 3 Encounters:  11/04/23 99 lb (44.9 kg)  10/29/23 98 lb (44.5 kg)  10/03/23 108 lb 6.4 oz (49.2 kg)     Medications:  Allergies as of 11/04/2023   No Known Allergies      Medication List        Accurate as of November 04, 2023 11:42 AM. If you have any questions, ask your nurse or doctor.          aspirin 81 MG chewable tablet Chew 81 mg by mouth daily.   cyanocobalamin  1000 MCG tablet Take 2,000 mcg by mouth daily.   hydroxyurea  500 MG capsule Commonly known as: HYDREA  TAKE 2 CAPSULES BY MOUTH DAILY   Naproxen Sodium 220 MG Caps Take 440 mg by mouth daily.   telmisartan  40 MG tablet Commonly known as: MICARDIS  TAKE ONE TABLET BY MOUTH DAILY   TYLENOL 500 MG tablet Generic drug: acetaminophen Take 500 mg by mouth every 6 (six) hours as needed for mild pain (pain score 1-3).        Allergies: No Known Allergies  Past Medical History, Surgical history, Social  history, and Family History were reviewed and updated.  Review of Systems: Review of Systems  Constitutional: Negative.   HENT: Negative.    Eyes: Negative.   Respiratory: Negative.    Cardiovascular: Negative.   Genitourinary: Negative.   Musculoskeletal: Negative.   Skin: Negative.   Neurological: Negative.   Endo/Heme/Allergies: Negative.   Psychiatric/Behavioral: Negative.       Physical Exam:  height is 4' 10 (1.473 m) and weight is 99 lb (44.9 kg). Her oral temperature is 98.1 F (36.7 C). Her blood pressure is 162/97 (abnormal) and her pulse is 102 (abnormal). Her respiration is 20 and oxygen saturation is 100%.   Wt Readings from Last 3 Encounters:  11/04/23 99 lb (44.9 kg)  10/29/23 98 lb (44.5 kg)  10/03/23 108 lb 6.4 oz (49.2 kg)    Physical Exam Vitals reviewed.  Constitutional:      Comments: Petite white female in no obvious distress.  She is alert and oriented x 3.  HENT:     Head: Normocephalic and atraumatic.   Eyes:     Pupils: Pupils are equal, round, and reactive to light.    Cardiovascular:     Rate and Rhythm: Normal rate and regular rhythm.  Heart sounds: Normal heart sounds.  Pulmonary:     Effort: Pulmonary effort is normal.     Breath sounds: Normal breath sounds.  Abdominal:     General: Bowel sounds are normal.     Palpations: Abdomen is soft.     Comments: Abdominal exam is soft.  She has decent bowel sounds.  There is no fluid wave.  I cannot palpate her liver or spleen tip.   Musculoskeletal:        General: No tenderness or deformity. Normal range of motion.     Cervical back: Normal range of motion.  Lymphadenopathy:     Cervical: No cervical adenopathy.   Skin:    General: Skin is warm and dry.     Findings: No erythema or rash.   Neurological:     Mental Status: She is alert and oriented to person, place, and time.   Psychiatric:        Behavior: Behavior normal.        Thought Content: Thought content normal.         Judgment: Judgment normal.     Lab Results  Component Value Date   WBC 4.2 11/04/2023   HGB 12.0 11/04/2023   HCT 34.0 (L) 11/04/2023   MCV 142.9 (H) 11/04/2023   PLT 342 11/04/2023   Lab Results  Component Value Date   FERRITIN 153 09/19/2023   IRON 152 09/19/2023   TIBC 335 09/19/2023   UIBC 183 09/19/2023   IRONPCTSAT 45 (H) 09/19/2023   Lab Results  Component Value Date   RETICCTPCT 1.4 11/04/2023   RBC 2.38 (L) 11/04/2023   No results found for: KPAFRELGTCHN, LAMBDASER, KAPLAMBRATIO No results found for: IGGSERUM, IGA, IGMSERUM No results found for: STEPHANY CARLOTA BENSON MARKEL EARLA JOANNIE DOC, MSPIKE, SPEI   Chemistry      Component Value Date/Time   NA 135 11/04/2023 1021   NA 143 03/04/2017 0808   NA 138 06/25/2016 1159   K 5.0 11/04/2023 1021   K 4.5 03/04/2017 0808   K 3.9 06/25/2016 1159   CL 99 11/04/2023 1021   CL 102 03/04/2017 0808   CO2 28 11/04/2023 1021   CO2 29 03/04/2017 0808   CO2 23 06/25/2016 1159   BUN 15 11/04/2023 1021   BUN 14 03/04/2017 0808   BUN 10.3 06/25/2016 1159   CREATININE 0.80 11/04/2023 1021   CREATININE 0.9 03/04/2017 0808   CREATININE 0.9 06/25/2016 1159      Component Value Date/Time   CALCIUM 9.8 11/04/2023 1021   CALCIUM 9.4 03/04/2017 0808   CALCIUM 9.7 06/25/2016 1159   ALKPHOS 96 11/04/2023 1021   ALKPHOS 86 (H) 03/04/2017 0808   ALKPHOS 96 06/25/2016 1159   AST 12 (L) 11/04/2023 1021   AST 21 06/25/2016 1159   ALT 13 11/04/2023 1021   ALT 20 03/04/2017 0808   ALT 16 06/25/2016 1159   BILITOT 0.4 11/04/2023 1021   BILITOT 0.44 06/25/2016 1159       Impression and Plan: Cassandra Moses is a very pleasant 68 year old Caucasian female with polycythemia vera.  The blood disorder is JAK2 positive.  She is on Hydrea .   Of note, she was found to have some B12 deficiency.  She is on vitamin B12 orally.  This could certainly be helping her out.  I am glad that  her blood counts are better.  I really do not think that we have to do any additional testing.  I think we get her  through the Summer now.  I know that she is not to keen about having to come back too quickly.  I would like to see her back in October.  Maude JONELLE Crease, MD 6/23/202511:42 AM

## 2023-11-04 NOTE — Progress Notes (Signed)
 BP 162/97, Instructed to monitor BP and keep diary, to make a PCP appointment. Verbalized understanding.

## 2023-11-06 LAB — METHYLMALONIC ACID, SERUM: Methylmalonic Acid, Quantitative: 125 nmol/L (ref 0–378)

## 2023-11-16 LAB — MYELODYSPLASTIC SYNDROME (MDS), FISH PANEL

## 2023-12-10 ENCOUNTER — Ambulatory Visit: Admitting: Family

## 2023-12-12 ENCOUNTER — Other Ambulatory Visit: Payer: No Typology Code available for payment source

## 2023-12-12 ENCOUNTER — Ambulatory Visit: Payer: No Typology Code available for payment source | Admitting: Medical Oncology

## 2023-12-30 ENCOUNTER — Other Ambulatory Visit: Payer: Self-pay | Admitting: Hematology & Oncology

## 2024-03-09 ENCOUNTER — Inpatient Hospital Stay: Attending: Hematology & Oncology

## 2024-03-09 ENCOUNTER — Encounter: Payer: Self-pay | Admitting: Hematology & Oncology

## 2024-03-09 ENCOUNTER — Inpatient Hospital Stay: Admitting: Hematology & Oncology

## 2024-03-09 ENCOUNTER — Other Ambulatory Visit: Payer: Self-pay | Admitting: *Deleted

## 2024-03-09 VITALS — BP 131/60 | HR 103 | Temp 97.5°F | Resp 20 | Ht <= 58 in | Wt 95.8 lb

## 2024-03-09 DIAGNOSIS — R634 Abnormal weight loss: Secondary | ICD-10-CM

## 2024-03-09 DIAGNOSIS — Z7982 Long term (current) use of aspirin: Secondary | ICD-10-CM | POA: Diagnosis not present

## 2024-03-09 DIAGNOSIS — D45 Polycythemia vera: Secondary | ICD-10-CM

## 2024-03-09 DIAGNOSIS — D649 Anemia, unspecified: Secondary | ICD-10-CM | POA: Diagnosis not present

## 2024-03-09 DIAGNOSIS — E538 Deficiency of other specified B group vitamins: Secondary | ICD-10-CM | POA: Diagnosis not present

## 2024-03-09 DIAGNOSIS — R61 Generalized hyperhidrosis: Secondary | ICD-10-CM

## 2024-03-09 DIAGNOSIS — R5383 Other fatigue: Secondary | ICD-10-CM

## 2024-03-09 DIAGNOSIS — Z79899 Other long term (current) drug therapy: Secondary | ICD-10-CM | POA: Diagnosis not present

## 2024-03-09 DIAGNOSIS — R0789 Other chest pain: Secondary | ICD-10-CM

## 2024-03-09 DIAGNOSIS — R935 Abnormal findings on diagnostic imaging of other abdominal regions, including retroperitoneum: Secondary | ICD-10-CM

## 2024-03-09 DIAGNOSIS — D539 Nutritional anemia, unspecified: Secondary | ICD-10-CM

## 2024-03-09 DIAGNOSIS — I712 Thoracic aortic aneurysm, without rupture, unspecified: Secondary | ICD-10-CM | POA: Diagnosis not present

## 2024-03-09 DIAGNOSIS — I771 Stricture of artery: Secondary | ICD-10-CM

## 2024-03-09 DIAGNOSIS — R1012 Left upper quadrant pain: Secondary | ICD-10-CM

## 2024-03-09 LAB — CBC WITH DIFFERENTIAL (CANCER CENTER ONLY)
Abs Immature Granulocytes: 0.01 K/uL (ref 0.00–0.07)
Basophils Absolute: 0 K/uL (ref 0.0–0.1)
Basophils Relative: 0 %
Eosinophils Absolute: 0 K/uL (ref 0.0–0.5)
Eosinophils Relative: 0 %
HCT: 21.6 % — ABNORMAL LOW (ref 36.0–46.0)
Hemoglobin: 7.7 g/dL — ABNORMAL LOW (ref 12.0–15.0)
Immature Granulocytes: 0 %
Lymphocytes Relative: 40 %
Lymphs Abs: 1.2 K/uL (ref 0.7–4.0)
MCH: 50.7 pg — ABNORMAL HIGH (ref 26.0–34.0)
MCHC: 35.6 g/dL (ref 30.0–36.0)
MCV: 142.1 fL — ABNORMAL HIGH (ref 80.0–100.0)
Monocytes Absolute: 0.5 K/uL (ref 0.1–1.0)
Monocytes Relative: 16 %
Neutro Abs: 1.3 K/uL — ABNORMAL LOW (ref 1.7–7.7)
Neutrophils Relative %: 44 %
Platelet Count: 335 K/uL (ref 150–400)
RBC: 1.52 MIL/uL — ABNORMAL LOW (ref 3.87–5.11)
RDW: 14.1 % (ref 11.5–15.5)
WBC Count: 3 K/uL — ABNORMAL LOW (ref 4.0–10.5)
nRBC: 0 % (ref 0.0–0.2)

## 2024-03-09 LAB — CMP (CANCER CENTER ONLY)
ALT: 11 U/L (ref 0–44)
AST: 19 U/L (ref 15–41)
Albumin: 4.3 g/dL (ref 3.5–5.0)
Alkaline Phosphatase: 108 U/L (ref 38–126)
Anion gap: 13 (ref 5–15)
BUN: 14 mg/dL (ref 8–23)
CO2: 22 mmol/L (ref 22–32)
Calcium: 9.4 mg/dL (ref 8.9–10.3)
Chloride: 100 mmol/L (ref 98–111)
Creatinine: 0.81 mg/dL (ref 0.44–1.00)
GFR, Estimated: >60 mL/min (ref >60)
Glucose, Bld: 130 mg/dL — ABNORMAL HIGH (ref 70–99)
Potassium: 4.5 mmol/L (ref 3.5–5.1)
Sodium: 134 mmol/L — ABNORMAL LOW (ref 135–145)
Total Bilirubin: 0.5 mg/dL (ref 0.0–1.2)
Total Protein: 7.2 g/dL (ref 6.5–8.1)

## 2024-03-09 LAB — VITAMIN B12: Vitamin B-12: 3253 pg/mL — ABNORMAL HIGH (ref 180–914)

## 2024-03-09 LAB — IRON AND IRON BINDING CAPACITY (CC-WL,HP ONLY)
Iron: 167 ug/dL (ref 28–170)
Saturation Ratios: 48 % — ABNORMAL HIGH (ref 10.4–31.8)
TIBC: 350 ug/dL (ref 250–450)
UIBC: 183 ug/dL

## 2024-03-09 LAB — FERRITIN: Ferritin: 165 ng/mL (ref 11–307)

## 2024-03-09 LAB — LACTATE DEHYDROGENASE: LDH: 219 U/L — ABNORMAL HIGH (ref 98–192)

## 2024-03-09 LAB — SAVE SMEAR(SSMR), FOR PROVIDER SLIDE REVIEW

## 2024-03-09 NOTE — Progress Notes (Signed)
 Hematology and Oncology Follow Up Visit  Cassandra Moses [id number] 1956/01/08 68 y.o. 03/09/2024   Principle Diagnosis:  Polycythemia vera-JAK2 positive  Current Therapy:   Hydrea   1000mg  po q day  Aspirin 81 mg by mouth daily Phlebotomy to maintain hematocrit below 45%   Interim History:  Cassandra Moses is here today for follow-up.  She is not doing well at all.  She is quite anemic now.  Her white cell count is also down a little bit.  I have to worry about the possibility of her bone marrow transforming into a myelofibrotic condition.  I looked at her blood smear under the microscope.  I really do not see anything that looks like myelofibrosis.  I will not have her stop the Hydrea  right now.  The bigger issue is that she lost vision in her right eye.  She said to happen on Thursday.  She has not seen her eye doctor.  She promises that she will see her eye doctor today.  I do not know if she may have blood behind the eye where she has a clot in one of the retinal vessels.  She is on baby aspirin.  Clearly, things have changed.  We last saw her on the back in June.  At that time, things were doing pretty well for her.  Now, her blood counts are certainly a lot different.  Hopefully, we will not have any problems with blood clotting.  Hopefully, her blood counts will improve with being off the Hydrea .  I will have to get a ultrasound.  She has she had a CT scan that was done back in May of this year.  This did not show any splenomegaly.  Another issue is the fact that she has a thoracic aortic aneurysm.  She did see, Thoracic Surgery.  They want her to go to [facility] for an evaluation.  I am unsure if she really wants to do this.  She has had no chest pain.  She has had no abdominal pain.  There is been no change in bowel or bladder habits.  She has not noted any rashes.  Overall, I would have to set her performance status of prior ECOG 1.    Wt Readings from Last 3  Encounters:  03/09/24 95 lb 12.8 oz (43.5 kg)  11/04/23 99 lb (44.9 kg)  10/29/23 98 lb (44.5 kg)     Medications:  Allergies as of 03/09/2024   No Known Allergies      Medication List        Accurate as of March 09, 2024 11:15 AM. If you have any questions, ask your nurse or doctor.          aspirin 81 MG chewable tablet Chew 81 mg by mouth daily.   cyanocobalamin  1000 MCG tablet Take 2,000 mcg by mouth daily. What changed:  how much to take when to take this   hydroxyurea  500 MG capsule Commonly known as: HYDREA  TAKE 2 CAPSULES BY MOUTH DAILY   Naproxen Sodium 220 MG Caps Take 440 mg by mouth daily. What changed: how much to take   telmisartan  40 MG tablet Commonly known as: MICARDIS  TAKE ONE TABLET BY MOUTH DAILY   TYLENOL 500 MG tablet Generic drug: acetaminophen Take 500 mg by mouth every 6 (six) hours as needed for mild pain (pain score 1-3).        Allergies: No Known Allergies  Past Medical History, Surgical history, Social history, and Family History  were reviewed and updated.  Review of Systems: Review of Systems  Constitutional: Negative.   HENT: Negative.    Eyes: Negative.   Respiratory: Negative.    Cardiovascular: Negative.   Genitourinary: Negative.   Musculoskeletal: Negative.   Skin: Negative.   Neurological: Negative.   Endo/Heme/Allergies: Negative.   Psychiatric/Behavioral: Negative.       Physical Exam:  height is 4' 9 (1.448 m) and weight is 95 lb 12.8 oz (43.5 kg). Her oral temperature is 97.5 F (36.4 C) (abnormal). Her blood pressure is 131/60 and her pulse is 103 (abnormal). Her respiration is 20 and oxygen saturation is 100%.   Wt Readings from Last 3 Encounters:  03/09/24 95 lb 12.8 oz (43.5 kg)  11/04/23 99 lb (44.9 kg)  10/29/23 98 lb (44.5 kg)    Physical Exam Vitals reviewed.  Constitutional:      Comments: Petite white female in no obvious distress.  She is alert and oriented x 3.  HENT:      Head: Normocephalic and atraumatic.  Eyes:     Pupils: Pupils are equal, round, and reactive to light.  Cardiovascular:     Rate and Rhythm: Normal rate and regular rhythm.     Heart sounds: Normal heart sounds.  Pulmonary:     Effort: Pulmonary effort is normal.     Breath sounds: Normal breath sounds.  Abdominal:     General: Bowel sounds are normal.     Palpations: Abdomen is soft.     Comments: Abdominal exam is soft.  She has decent bowel sounds.  There is no fluid wave.  I cannot palpate her liver or spleen tip.  Musculoskeletal:        General: No tenderness or deformity. Normal range of motion.     Cervical back: Normal range of motion.  Lymphadenopathy:     Cervical: No cervical adenopathy.  Skin:    General: Skin is warm and dry.     Findings: No erythema or rash.  Neurological:     Mental Status: She is alert and oriented to person, place, and time.  Psychiatric:        Behavior: Behavior normal.        Thought Content: Thought content normal.        Judgment: Judgment normal.     Lab Results  Component Value Date   WBC 3.0 (L) 03/09/2024   HGB 7.7 (L) 03/09/2024   HCT 21.6 (L) 03/09/2024   MCV 142.1 (H) 03/09/2024   PLT 335 03/09/2024   Lab Results  Component Value Date   FERRITIN 153 09/19/2023   IRON 152 09/19/2023   TIBC 335 09/19/2023   UIBC 183 09/19/2023   IRONPCTSAT 45 (H) 09/19/2023   Lab Results  Component Value Date   RETICCTPCT 1.4 11/04/2023   RBC 1.52 (L) 03/09/2024   No results found for: KPAFRELGTCHN, LAMBDASER, KAPLAMBRATIO No results found for: IGGSERUM, IGA, IGMSERUM No results found for: STEPHANY CARLOTA BENSON MARKEL EARLA JOANNIE DOC, MSPIKE, SPEI   Chemistry      Component Value Date/Time   NA 134 (L) 03/09/2024 1011   NA 143 03/04/2017 0808   NA 138 06/25/2016 1159   K 4.5 03/09/2024 1011   K 4.5 03/04/2017 0808   K 3.9 06/25/2016 1159   CL 100 03/09/2024 1011   CL 102 03/04/2017  0808   CO2 22 03/09/2024 1011   CO2 29 03/04/2017 0808   CO2 23 06/25/2016 1159   BUN 14 03/09/2024  1011   BUN 14 03/04/2017 0808   BUN 10.3 06/25/2016 1159   CREATININE 0.81 03/09/2024 1011   CREATININE 0.9 03/04/2017 0808   CREATININE 0.9 06/25/2016 1159      Component Value Date/Time   CALCIUM 9.4 03/09/2024 1011   CALCIUM 9.4 03/04/2017 0808   CALCIUM 9.7 06/25/2016 1159   ALKPHOS 108 03/09/2024 1011   ALKPHOS 86 (H) 03/04/2017 0808   ALKPHOS 96 06/25/2016 1159   AST 19 03/09/2024 1011   AST 21 06/25/2016 1159   ALT 11 03/09/2024 1011   ALT 20 03/04/2017 0808   ALT 16 06/25/2016 1159   BILITOT 0.5 03/09/2024 1011   BILITOT 0.44 06/25/2016 1159       Impression and Plan: Cassandra Moses is a very pleasant 68 year old Caucasian female with polycythemia vera.  The blood disorder is JAK2 positive.  She is on Hydrea .   Again, her blood counts are certainly different.  I looked at her blood smear under the microscope.  I did not see anything that looked suspicious for transformation to myelofibrosis.  I did not see any teardrop cells.  There were no nucleated red blood cells.  I saw no immature myeloid cells.  We will have to check her vitamin B-12 level when we see her back.  We still may have to think about a bone marrow biopsy, particularly if her blood counts do not improve off the Hydrea .  I would also like to get a ultrasound of her spleen.  We will see about getting this next week.  This is certainly unfortunate.  Hopefully, she will be able to have some vision with her right eye.  A lot is going on with Cassandra Moses.  I just hate that so much is happening with her.  I will see her back in 2 weeks.  Of note, she was found to have some B12 deficiency.  She is on vitamin B12 orally.  This could certainly be helping her out.  I am glad that her blood counts are better.  I really do not think that we have to do any additional testing.  I think we get her through the  Summer now.  I know that she is not to keen about having to come back too quickly.  I would like to see her back in October.  Maude JONELLE Crease, MD 10/27/202511:15 AM

## 2024-03-11 ENCOUNTER — Telehealth (HOSPITAL_BASED_OUTPATIENT_CLINIC_OR_DEPARTMENT_OTHER): Payer: Self-pay

## 2024-03-14 ENCOUNTER — Other Ambulatory Visit: Payer: Self-pay | Admitting: Hematology & Oncology

## 2024-03-16 ENCOUNTER — Encounter: Payer: Self-pay | Admitting: Hematology & Oncology

## 2024-03-18 ENCOUNTER — Ambulatory Visit (HOSPITAL_BASED_OUTPATIENT_CLINIC_OR_DEPARTMENT_OTHER)
Admission: RE | Admit: 2024-03-18 | Discharge: 2024-03-18 | Disposition: A | Discharge status: Home / Self Care | Source: Ambulatory Visit | Attending: Hematology & Oncology | Admitting: Hematology & Oncology

## 2024-03-18 DIAGNOSIS — D45 Polycythemia vera: Secondary | ICD-10-CM | POA: Diagnosis present

## 2024-03-19 ENCOUNTER — Ambulatory Visit: Payer: Self-pay | Admitting: Hematology & Oncology

## 2024-03-23 ENCOUNTER — Inpatient Hospital Stay

## 2024-03-23 ENCOUNTER — Inpatient Hospital Stay: Admitting: Hematology & Oncology

## 2024-04-06 ENCOUNTER — Inpatient Hospital Stay (HOSPITAL_BASED_OUTPATIENT_CLINIC_OR_DEPARTMENT_OTHER): Admitting: Hematology & Oncology

## 2024-04-06 ENCOUNTER — Inpatient Hospital Stay: Attending: Hematology & Oncology

## 2024-04-06 ENCOUNTER — Other Ambulatory Visit: Payer: Self-pay

## 2024-04-06 ENCOUNTER — Encounter: Payer: Self-pay | Admitting: Hematology & Oncology

## 2024-04-06 VITALS — BP 159/79 | HR 97 | Temp 98.3°F | Resp 18 | Ht <= 58 in | Wt 98.0 lb

## 2024-04-06 DIAGNOSIS — Z7982 Long term (current) use of aspirin: Secondary | ICD-10-CM | POA: Insufficient documentation

## 2024-04-06 DIAGNOSIS — D45 Polycythemia vera: Secondary | ICD-10-CM | POA: Insufficient documentation

## 2024-04-06 DIAGNOSIS — Z79899 Other long term (current) drug therapy: Secondary | ICD-10-CM | POA: Diagnosis not present

## 2024-04-06 LAB — CMP (CANCER CENTER ONLY)
ALT: 20 U/L (ref 0–44)
AST: 25 U/L (ref 15–41)
Albumin: 4.6 g/dL (ref 3.5–5.0)
Alkaline Phosphatase: 109 U/L (ref 38–126)
Anion gap: 12 (ref 5–15)
BUN: 11 mg/dL (ref 8–23)
CO2: 24 mmol/L (ref 22–32)
Calcium: 9.5 mg/dL (ref 8.9–10.3)
Chloride: 98 mmol/L (ref 98–111)
Creatinine: 0.78 mg/dL (ref 0.44–1.00)
GFR, Estimated: >60 mL/min (ref >60)
Glucose, Bld: 107 mg/dL — ABNORMAL HIGH (ref 70–99)
Potassium: 4.5 mmol/L (ref 3.5–5.1)
Sodium: 134 mmol/L — ABNORMAL LOW (ref 135–145)
Total Bilirubin: 0.4 mg/dL (ref 0.0–1.2)
Total Protein: 7.3 g/dL (ref 6.5–8.1)

## 2024-04-06 LAB — IRON AND IRON BINDING CAPACITY (CC-WL,HP ONLY)
Iron: 52 ug/dL (ref 28–170)
Saturation Ratios: 13 % (ref 10.4–31.8)
TIBC: 405 ug/dL (ref 250–450)
UIBC: 353 ug/dL

## 2024-04-06 LAB — CBC WITH DIFFERENTIAL (CANCER CENTER ONLY)
Abs Immature Granulocytes: 0.02 K/uL (ref 0.00–0.07)
Basophils Absolute: 0 K/uL (ref 0.0–0.1)
Basophils Relative: 0 %
Eosinophils Absolute: 0.1 K/uL (ref 0.0–0.5)
Eosinophils Relative: 1 %
HCT: 33.3 % — ABNORMAL LOW (ref 36.0–46.0)
Hemoglobin: 11.2 g/dL — ABNORMAL LOW (ref 12.0–15.0)
Immature Granulocytes: 0 %
Lymphocytes Relative: 46 %
Lymphs Abs: 2.3 K/uL (ref 0.7–4.0)
MCH: 44.8 pg — ABNORMAL HIGH (ref 26.0–34.0)
MCHC: 33.6 g/dL (ref 30.0–36.0)
MCV: 133.2 fL — ABNORMAL HIGH (ref 80.0–100.0)
Monocytes Absolute: 0.7 K/uL (ref 0.1–1.0)
Monocytes Relative: 13 %
Neutro Abs: 2.1 K/uL (ref 1.7–7.7)
Neutrophils Relative %: 40 %
Platelet Count: 472 K/uL — ABNORMAL HIGH (ref 150–400)
RBC: 2.5 MIL/uL — ABNORMAL LOW (ref 3.87–5.11)
RDW: 13.4 % (ref 11.5–15.5)
WBC Count: 5.2 K/uL (ref 4.0–10.5)
nRBC: 0 % (ref 0.0–0.2)

## 2024-04-06 LAB — RETICULOCYTES
Immature Retic Fract: 26.1 % — ABNORMAL HIGH (ref 2.3–15.9)
RBC.: 2.47 MIL/uL — ABNORMAL LOW (ref 3.87–5.11)
Retic Count, Absolute: 124 K/uL (ref 19.0–186.0)
Retic Ct Pct: 5 % — ABNORMAL HIGH (ref 0.4–3.1)

## 2024-04-06 LAB — FERRITIN: Ferritin: 37 ng/mL (ref 11–307)

## 2024-04-06 LAB — SAVE SMEAR(SSMR), FOR PROVIDER SLIDE REVIEW

## 2024-04-06 LAB — SAMPLE TO BLOOD BANK

## 2024-04-06 LAB — LACTATE DEHYDROGENASE: LDH: 312 U/L — ABNORMAL HIGH (ref 105–235)

## 2024-04-06 LAB — VITAMIN B12: Vitamin B-12: 1763 pg/mL — ABNORMAL HIGH (ref 180–914)

## 2024-04-06 NOTE — Progress Notes (Signed)
 Hematology and Oncology Follow Up Visit  Cassandra Moses [id number] Nov 05, 1955 68 y.o. 04/06/2024   Principle Diagnosis:  Polycythemia vera-JAK2 positive  Current Therapy:   Hydrea   1000mg  po q day  - d/c on 03/18/2024 Aspirin 81 mg by mouth daily Phlebotomy to maintain hematocrit below 45%   Interim History:  Cassandra Moses is here today for follow-up.  She is certainly doing better.  Unfortunately, she may have lost sight in her right eye.  I really hate that for her.  We stopped the Hydrea .  Her blood counts have improved.  We did do a ultrasound of her spleen.  This did not show any splenomegaly.  She is feeling better.  Her hemoglobin is a whole lot better now.  I think that regard to how to switch out the Hydrea  and see about getting her on anagrelide.  I think anagrelide would not be a better idea for her.  This would help control her platelets which seem to be going up a little bit more quickly.  I think that we can avoid having to do a bone marrow biopsy on her right now.  She has had no fever.  She has had no bleeding.  She has had no rashes.  There is no change in bowel or bladder habits.  Overall, I will say that her performance status is probably ECOG 1.      Wt Readings from Last 3 Encounters:  03/09/24 95 lb 12.8 oz (43.5 kg)  11/04/23 99 lb (44.9 kg)  10/29/23 98 lb (44.5 kg)     Medications:  Allergies as of 04/06/2024   No Known Allergies      Medication List        Accurate as of April 06, 2024  2:01 PM. If you have any questions, ask your nurse or doctor.          aspirin 81 MG chewable tablet Chew 81 mg by mouth daily.   cyanocobalamin  1000 MCG tablet Take 2,000 mcg by mouth daily. What changed:  how much to take when to take this   hydroxyurea  500 MG capsule Commonly known as: HYDREA  TAKE 2 CAPSULES BY MOUTH DAILY   Naproxen Sodium 220 MG Caps Take 440 mg by mouth daily. What changed: how much to take   telmisartan  40  MG tablet Commonly known as: MICARDIS  TAKE 1 TABLET BY MOUTH DAILY   TYLENOL 500 MG tablet Generic drug: acetaminophen Take 500 mg by mouth every 6 (six) hours as needed for mild pain (pain score 1-3).        Allergies: No Known Allergies  Past Medical History, Surgical history, Social history, and Family History were reviewed and updated.  Review of Systems: Review of Systems  Constitutional: Negative.   HENT: Negative.    Eyes: Negative.   Respiratory: Negative.    Cardiovascular: Negative.   Genitourinary: Negative.   Musculoskeletal: Negative.   Skin: Negative.   Neurological: Negative.   Endo/Heme/Allergies: Negative.   Psychiatric/Behavioral: Negative.       Physical Exam:  Vital signs were temperature 98.3.  Pulse 97.  Blood pressure 159/79.  Weight is 98 pounds.  Wt Readings from Last 3 Encounters:  03/09/24 95 lb 12.8 oz (43.5 kg)  11/04/23 99 lb (44.9 kg)  10/29/23 98 lb (44.5 kg)    Physical Exam Vitals reviewed.  Constitutional:      Comments: Petite white female in no obvious distress.  She is alert and oriented x 3.  HENT:  Head: Normocephalic and atraumatic.  Eyes:     Pupils: Pupils are equal, round, and reactive to light.  Cardiovascular:     Rate and Rhythm: Normal rate and regular rhythm.     Heart sounds: Normal heart sounds.  Pulmonary:     Effort: Pulmonary effort is normal.     Breath sounds: Normal breath sounds.  Abdominal:     General: Bowel sounds are normal.     Palpations: Abdomen is soft.     Comments: Abdominal exam is soft.  She has decent bowel sounds.  There is no fluid wave.  I cannot palpate her liver or spleen tip.  Musculoskeletal:        General: No tenderness or deformity. Normal range of motion.     Cervical back: Normal range of motion.  Lymphadenopathy:     Cervical: No cervical adenopathy.  Skin:    General: Skin is warm and dry.     Findings: No erythema or rash.  Neurological:     Mental Status: She  is alert and oriented to person, place, and time.  Psychiatric:        Behavior: Behavior normal.        Thought Content: Thought content normal.        Judgment: Judgment normal.     Lab Results  Component Value Date   WBC 5.2 04/06/2024   HGB 11.2 (L) 04/06/2024   HCT 33.3 (L) 04/06/2024   MCV 133.2 (H) 04/06/2024   PLT 472 (H) 04/06/2024   Lab Results  Component Value Date   FERRITIN 165 03/09/2024   IRON 167 03/09/2024   TIBC 350 03/09/2024   UIBC 183 03/09/2024   IRONPCTSAT 48 (H) 03/09/2024   Lab Results  Component Value Date   RETICCTPCT 5.0 (H) 04/06/2024   RBC 2.47 (L) 04/06/2024   No results found for: KPAFRELGTCHN, LAMBDASER, KAPLAMBRATIO No results found for: IGGSERUM, IGA, IGMSERUM No results found for: STEPHANY CARLOTA BENSON MARKEL EARLA JOANNIE DOC, MSPIKE, SPEI   Chemistry      Component Value Date/Time   NA 134 (L) 03/09/2024 1011   NA 143 03/04/2017 0808   NA 138 06/25/2016 1159   K 4.5 03/09/2024 1011   K 4.5 03/04/2017 0808   K 3.9 06/25/2016 1159   CL 100 03/09/2024 1011   CL 102 03/04/2017 0808   CO2 22 03/09/2024 1011   CO2 29 03/04/2017 0808   CO2 23 06/25/2016 1159   BUN 14 03/09/2024 1011   BUN 14 03/04/2017 0808   BUN 10.3 06/25/2016 1159   CREATININE 0.81 03/09/2024 1011   CREATININE 0.9 03/04/2017 0808   CREATININE 0.9 06/25/2016 1159      Component Value Date/Time   CALCIUM 9.4 03/09/2024 1011   CALCIUM 9.4 03/04/2017 0808   CALCIUM 9.7 06/25/2016 1159   ALKPHOS 108 03/09/2024 1011   ALKPHOS 86 (H) 03/04/2017 0808   ALKPHOS 96 06/25/2016 1159   AST 19 03/09/2024 1011   AST 21 06/25/2016 1159   ALT 11 03/09/2024 1011   ALT 20 03/04/2017 0808   ALT 16 06/25/2016 1159   BILITOT 0.5 03/09/2024 1011   BILITOT 0.44 06/25/2016 1159       Impression and Plan: Cassandra Moses is a very pleasant 68 year old Caucasian female with polycythemia vera.  The blood disorder is JAK2 positive.   She had been on Hydrea .   Thankfully, her blood counts are improving.  We have to watch out for platelets.  I want to  try to hold off on anagrelide right now.  I want to have her come back in 3 weeks just for lab work.  If we see that the platelets are going up further, then we will see about getting her on anagrelide.  I hate that she lost vision in the right eye.  I think she has an appointment with an ophthalmologist in 1 week.  I will probably see her back in about 6 weeks or so.  Again, I wanted to have her come back in just 3 weeks for lab work.   Maude JONELLE Crease, MD 11/24/20252:01 PM

## 2024-04-29 ENCOUNTER — Encounter: Payer: Self-pay | Admitting: Hematology & Oncology

## 2024-04-29 ENCOUNTER — Inpatient Hospital Stay: Admitting: Hematology & Oncology

## 2024-04-29 ENCOUNTER — Inpatient Hospital Stay: Attending: Hematology & Oncology

## 2024-04-29 ENCOUNTER — Other Ambulatory Visit: Payer: Self-pay

## 2024-04-29 VITALS — BP 130/87 | HR 96 | Temp 97.6°F | Resp 20 | Ht <= 58 in | Wt 97.0 lb

## 2024-04-29 DIAGNOSIS — I7123 Aneurysm of the descending thoracic aorta, without rupture: Secondary | ICD-10-CM | POA: Insufficient documentation

## 2024-04-29 DIAGNOSIS — Z7982 Long term (current) use of aspirin: Secondary | ICD-10-CM | POA: Diagnosis not present

## 2024-04-29 DIAGNOSIS — Z79899 Other long term (current) drug therapy: Secondary | ICD-10-CM | POA: Insufficient documentation

## 2024-04-29 DIAGNOSIS — D45 Polycythemia vera: Secondary | ICD-10-CM

## 2024-04-29 LAB — CMP (CANCER CENTER ONLY)
ALT: 13 U/L (ref 0–44)
AST: 22 U/L (ref 15–41)
Albumin: 4.5 g/dL (ref 3.5–5.0)
Alkaline Phosphatase: 120 U/L (ref 38–126)
Anion gap: 10 (ref 5–15)
BUN: 10 mg/dL (ref 8–23)
CO2: 27 mmol/L (ref 22–32)
Calcium: 9.7 mg/dL (ref 8.9–10.3)
Chloride: 96 mmol/L — ABNORMAL LOW (ref 98–111)
Creatinine: 0.75 mg/dL (ref 0.44–1.00)
GFR, Estimated: >60 mL/min (ref >60)
Glucose, Bld: 119 mg/dL — ABNORMAL HIGH (ref 70–99)
Potassium: 5.6 mmol/L — ABNORMAL HIGH (ref 3.5–5.1)
Sodium: 132 mmol/L — ABNORMAL LOW (ref 135–145)
Total Bilirubin: 0.3 mg/dL (ref 0.0–1.2)
Total Protein: 7.3 g/dL (ref 6.5–8.1)

## 2024-04-29 LAB — CBC WITH DIFFERENTIAL (CANCER CENTER ONLY)
Abs Immature Granulocytes: 0.02 K/uL (ref 0.00–0.07)
Basophils Absolute: 0.1 K/uL (ref 0.0–0.1)
Basophils Relative: 1 %
Eosinophils Absolute: 0.1 K/uL (ref 0.0–0.5)
Eosinophils Relative: 1 %
HCT: 40 % (ref 36.0–46.0)
Hemoglobin: 13.5 g/dL (ref 12.0–15.0)
Immature Granulocytes: 0 %
Lymphocytes Relative: 38 %
Lymphs Abs: 2.8 K/uL (ref 0.7–4.0)
MCH: 39.9 pg — ABNORMAL HIGH (ref 26.0–34.0)
MCHC: 33.8 g/dL (ref 30.0–36.0)
MCV: 118.3 fL — ABNORMAL HIGH (ref 80.0–100.0)
Monocytes Absolute: 0.9 K/uL (ref 0.1–1.0)
Monocytes Relative: 12 %
Neutro Abs: 3.5 K/uL (ref 1.7–7.7)
Neutrophils Relative %: 48 %
Platelet Count: 404 K/uL — ABNORMAL HIGH (ref 150–400)
RBC: 3.38 MIL/uL — ABNORMAL LOW (ref 3.87–5.11)
RDW: 15.1 % (ref 11.5–15.5)
WBC Count: 7.3 K/uL (ref 4.0–10.5)
nRBC: 0 % (ref 0.0–0.2)

## 2024-04-29 LAB — IRON AND IRON BINDING CAPACITY (CC-WL,HP ONLY)
Iron: 53 ug/dL (ref 28–170)
Saturation Ratios: 14 % (ref 10.4–31.8)
TIBC: 370 ug/dL (ref 250–450)
UIBC: 317 ug/dL

## 2024-04-29 LAB — LACTATE DEHYDROGENASE: LDH: 221 U/L (ref 105–235)

## 2024-04-29 LAB — SAVE SMEAR(SSMR), FOR PROVIDER SLIDE REVIEW

## 2024-04-29 LAB — FERRITIN: Ferritin: 51 ng/mL (ref 11–307)

## 2024-04-29 NOTE — Progress Notes (Signed)
 Hematology and Oncology Follow Up Visit  Cassandra Moses [id number] 1956-02-14 68 y.o. 04/29/2024   Principle Diagnosis:  Polycythemia vera-JAK2 positive  Current Therapy:   Hydrea   1000mg  po q day  - d/c on 03/18/2024 Aspirin 81 mg by mouth daily Phlebotomy to maintain hematocrit below 45%   Interim History:  Cassandra Moses is here today for follow-up.  Cassandra Moses is certainly doing better.  Unfortunately, Cassandra Moses may have lost sight in her right eye.  I really hate that for her.  We stopped the Hydrea .  Her blood counts have improved.  We did do a ultrasound of her spleen.  This did not show any splenomegaly.  Cassandra Moses is feeling better.  Her hemoglobin is a whole lot better now.  I think that regard to how to switch out the Hydrea  and see about getting her on anagrelide.  I think anagrelide would not be a better idea for her.  This would help control her platelets which seem to be going up a little bit more quickly.  I think that we can avoid having to do a bone marrow biopsy on her right now.  Cassandra Moses has had no fever.  Cassandra Moses has had no bleeding.  Cassandra Moses has had no rashes.  There is no change in bowel or bladder habits.  Overall, I will say that her performance status is probably ECOG 1.      Wt Readings from Last 3 Encounters:  04/29/24 97 lb (44 kg)  04/06/24 98 lb (44.5 kg)  03/09/24 95 lb 12.8 oz (43.5 kg)     Medications:  Allergies as of 04/29/2024   No Known Allergies      Medication List        Accurate as of April 29, 2024  3:59 PM. If you have any questions, ask your nurse or doctor.          aspirin 81 MG chewable tablet Chew 81 mg by mouth daily.   cyanocobalamin  1000 MCG tablet Take 2,000 mcg by mouth daily. What changed:  how much to take when to take this   hydroxyurea  500 MG capsule Commonly known as: HYDREA  TAKE 2 CAPSULES BY MOUTH DAILY   latanoprost 0.005 % ophthalmic solution Commonly known as: XALATAN Place 1 drop into both eyes.   Naproxen  Sodium 220 MG Caps Take 440 mg by mouth daily. What changed: how much to take   telmisartan  40 MG tablet Commonly known as: MICARDIS  TAKE 1 TABLET BY MOUTH DAILY   TYLENOL 500 MG tablet Generic drug: acetaminophen Take 500 mg by mouth every 6 (six) hours as needed for mild pain (pain score 1-3).        Allergies: No Known Allergies  Past Medical History, Surgical history, Social history, and Family History were reviewed and updated.  Review of Systems: Review of Systems  Constitutional: Negative.   HENT: Negative.    Eyes: Negative.   Respiratory: Negative.    Cardiovascular: Negative.   Genitourinary: Negative.   Musculoskeletal: Negative.   Skin: Negative.   Neurological: Negative.   Endo/Heme/Allergies: Negative.   Psychiatric/Behavioral: Negative.       Physical Exam:  Vital signs were temperature 97.6.  Pulse 96.  Blood pressure 130/87.  Weight is 97 pounds.   Wt Readings from Last 3 Encounters:  04/29/24 97 lb (44 kg)  04/06/24 98 lb (44.5 kg)  03/09/24 95 lb 12.8 oz (43.5 kg)    Physical Exam Vitals reviewed.  Constitutional:      Comments:  Petite white female in no obvious distress.  Cassandra Moses is alert and oriented x 3.  HENT:     Head: Normocephalic and atraumatic.  Eyes:     Pupils: Pupils are equal, round, and reactive to light.  Cardiovascular:     Rate and Rhythm: Normal rate and regular rhythm.     Heart sounds: Normal heart sounds.  Pulmonary:     Effort: Pulmonary effort is normal.     Breath sounds: Normal breath sounds.  Abdominal:     General: Bowel sounds are normal.     Palpations: Abdomen is soft.     Comments: Abdominal exam is soft.  Cassandra Moses has decent bowel sounds.  There is no fluid wave.  I cannot palpate her liver or spleen tip.  Musculoskeletal:        General: No tenderness or deformity. Normal range of motion.     Cervical back: Normal range of motion.  Lymphadenopathy:     Cervical: No cervical adenopathy.  Skin:    General:  Skin is warm and dry.     Findings: No erythema or rash.  Neurological:     Mental Status: Cassandra Moses is alert and oriented to person, place, and time.  Psychiatric:        Behavior: Behavior normal.        Thought Content: Thought content normal.        Judgment: Judgment normal.     Lab Results  Component Value Date   WBC 7.3 04/29/2024   HGB 13.5 04/29/2024   HCT 40.0 04/29/2024   MCV 118.3 (H) 04/29/2024   PLT 404 (H) 04/29/2024   Lab Results  Component Value Date   FERRITIN 37 04/06/2024   IRON 52 04/06/2024   TIBC 405 04/06/2024   UIBC 353 04/06/2024   IRONPCTSAT 13 04/06/2024   Lab Results  Component Value Date   RETICCTPCT 5.0 (H) 04/06/2024   RBC 3.38 (L) 04/29/2024   No results found for: KPAFRELGTCHN, LAMBDASER, KAPLAMBRATIO No results found for: IGGSERUM, IGA, IGMSERUM No results found for: STEPHANY CARLOTA BENSON MARKEL EARLA JOANNIE DOC, MSPIKE, SPEI   Chemistry      Component Value Date/Time   NA 134 (L) 04/06/2024 1325   NA 143 03/04/2017 0808   NA 138 06/25/2016 1159   K 4.5 04/06/2024 1325   K 4.5 03/04/2017 0808   K 3.9 06/25/2016 1159   CL 98 04/06/2024 1325   CL 102 03/04/2017 0808   CO2 24 04/06/2024 1325   CO2 29 03/04/2017 0808   CO2 23 06/25/2016 1159   BUN 11 04/06/2024 1325   BUN 14 03/04/2017 0808   BUN 10.3 06/25/2016 1159   CREATININE 0.78 04/06/2024 1325   CREATININE 0.9 03/04/2017 0808   CREATININE 0.9 06/25/2016 1159      Component Value Date/Time   CALCIUM 9.5 04/06/2024 1325   CALCIUM 9.4 03/04/2017 0808   CALCIUM 9.7 06/25/2016 1159   ALKPHOS 109 04/06/2024 1325   ALKPHOS 86 (H) 03/04/2017 0808   ALKPHOS 96 06/25/2016 1159   AST 25 04/06/2024 1325   AST 21 06/25/2016 1159   ALT 20 04/06/2024 1325   ALT 20 03/04/2017 0808   ALT 16 06/25/2016 1159   BILITOT 0.4 04/06/2024 1325   BILITOT 0.44 06/25/2016 1159       Impression and Plan: Cassandra Moses is a very pleasant  68 year old Caucasian female with polycythemia vera.  The blood disorder is JAK2 positive.  Cassandra Moses had been on Hydrea .  Thankfully, her blood counts are improving.  We have to watch out for platelets.  I want to try to hold off on anagrelide right now.  I want to have her come back in 3 weeks just for lab work.  If we see that the platelets are going up further, then we will see about getting her on anagrelide.  I hate that Cassandra Moses lost vision in the right eye.  I think Cassandra Moses has an appointment with an ophthalmologist in 1 week.  I will probably see her back in about 6 weeks or so.  Again, I wanted to have her come back in just 3 weeks for lab work.   Maude JONELLE Crease, MD 12/17/20253:59 PM   ADDENDUM: Cassandra Moses just came in for lab work today.  Her platelet count is better.  Everything looks very stable with her CBC.  Cassandra Moses is off Hydrea .  We will see about getting her back in 6 weeks now.  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  We will have to follow-up with this.  I know Cassandra Moses does not have a family doctor.  I think that Cassandra Moses has seen thoracic surgery.  Cassandra Moses has seen Dr. Magda.  We will follow-up with a another CT angiogram.  Cassandra Moses would like to have 1 done before 1 year.    Jeralyn Crease, MD

## 2024-05-19 DIAGNOSIS — I7123 Aneurysm of the descending thoracic aorta, without rupture: Secondary | ICD-10-CM

## 2024-06-11 ENCOUNTER — Inpatient Hospital Stay: Admitting: Hematology & Oncology

## 2024-06-11 ENCOUNTER — Inpatient Hospital Stay

## 2024-06-25 ENCOUNTER — Inpatient Hospital Stay: Admitting: Hematology & Oncology

## 2024-06-25 ENCOUNTER — Inpatient Hospital Stay
